# Patient Record
Sex: Female | Born: 1955 | Race: White | Hispanic: No | Marital: Married | Smoking: Never smoker
Health system: Southern US, Community
[De-identification: ages and names within clinical notes are randomized; demographics above are authoritative.]

## PROBLEM LIST (undated history)

## (undated) DIAGNOSIS — F419 Anxiety disorder, unspecified: Secondary | ICD-10-CM

## (undated) DIAGNOSIS — M797 Fibromyalgia: Secondary | ICD-10-CM

## (undated) DIAGNOSIS — M5126 Other intervertebral disc displacement, lumbar region: Secondary | ICD-10-CM

## (undated) DIAGNOSIS — M1990 Unspecified osteoarthritis, unspecified site: Secondary | ICD-10-CM

## (undated) HISTORY — PX: ABDOMINAL HYSTERECTOMY: SHX81

## (undated) HISTORY — PX: BILATERAL CARPAL TUNNEL RELEASE: SHX6508

## (undated) HISTORY — PX: SEPTOPLASTY: SUR1290

## (undated) HISTORY — PX: RECTOVAGINAL FISTULA CLOSURE: SUR265

---

## 1995-09-02 HISTORY — PX: BREAST BIOPSY: SHX20

## 2001-07-07 IMAGING — MG UNKNOWN MG STUDY
1 series · 6 of 6 positions shown · non-contrast
Comparison: none

REASON FOR EXAM: SCR......HM 226 [A2]...WOULD LIKE DIGITAL

[Series 1780: R CC · right · 6 of 6 slices shown]
[im 1/6]
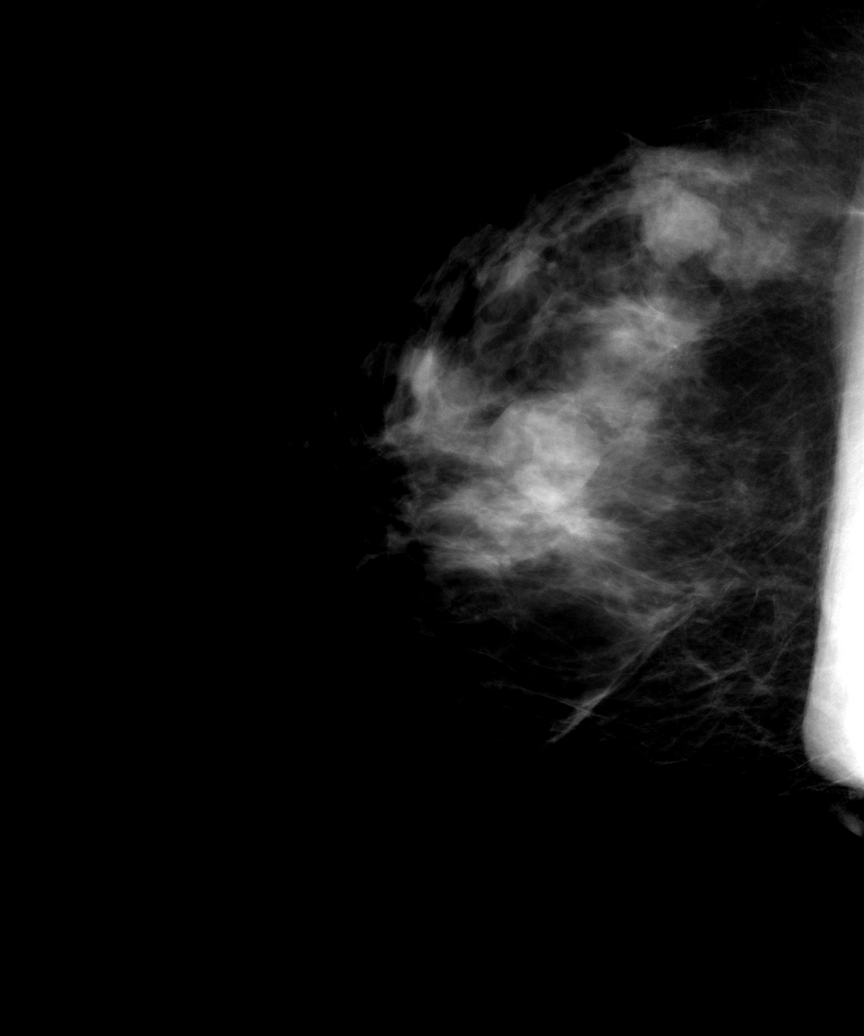
[im 2/6]
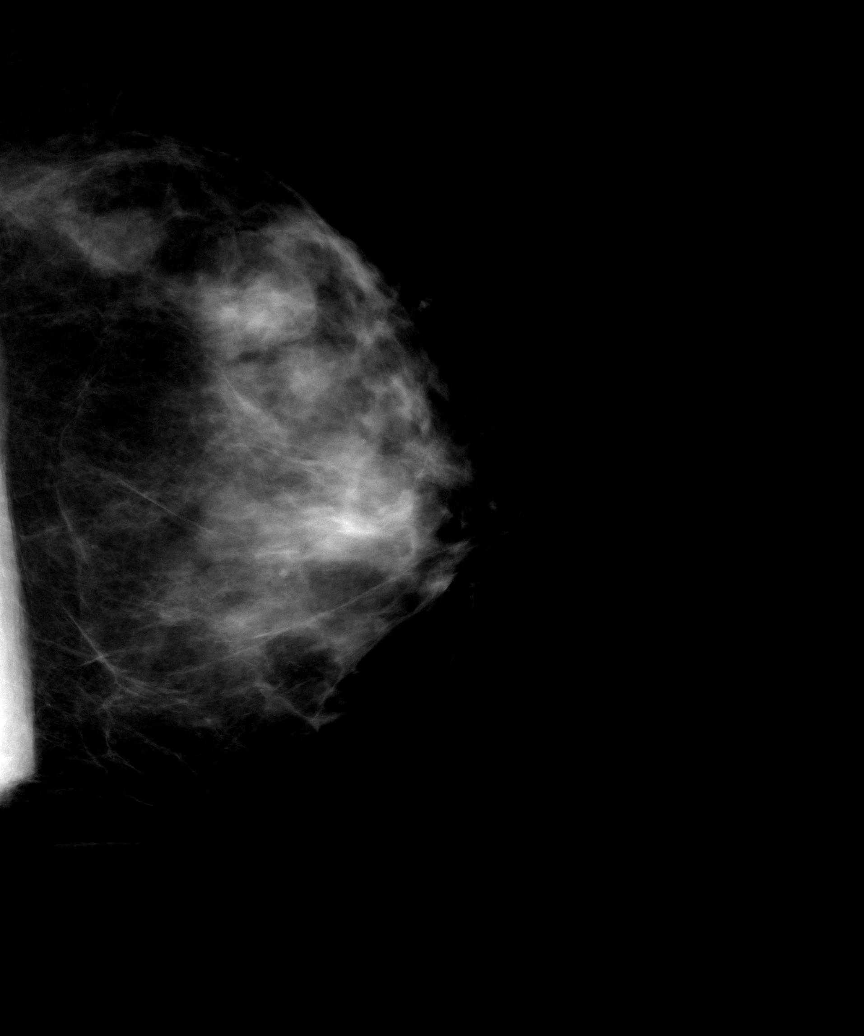
[im 3/6]
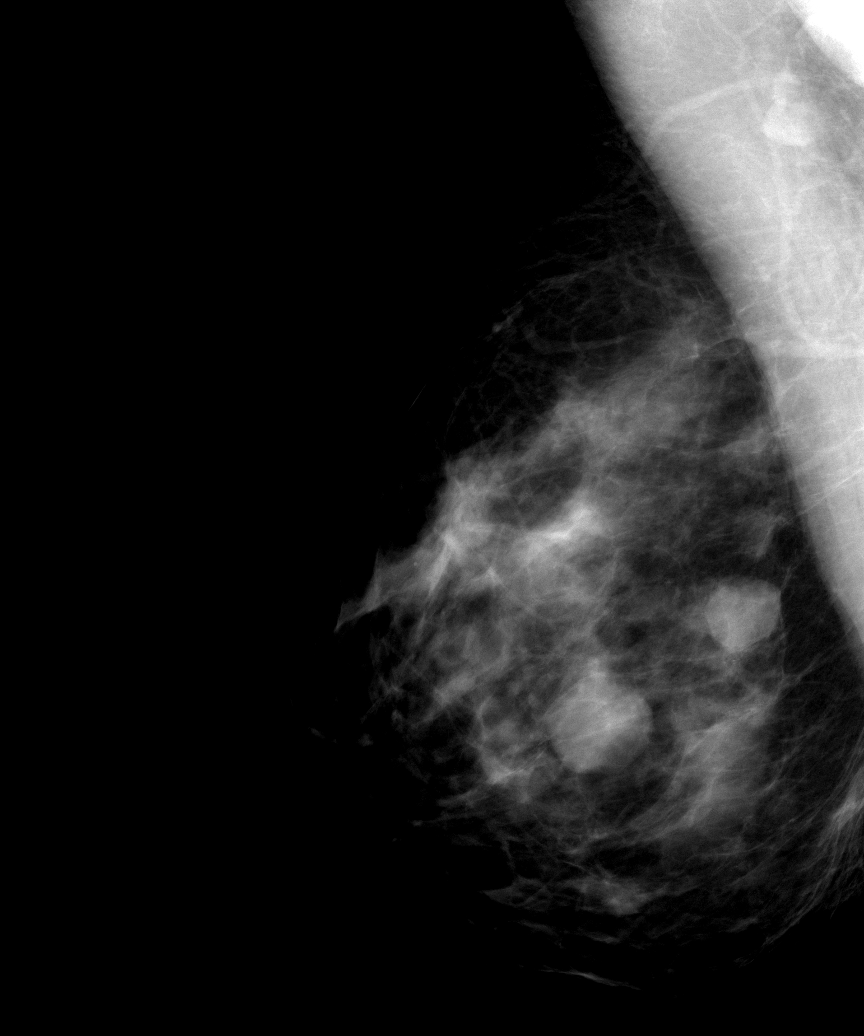
[im 4/6]
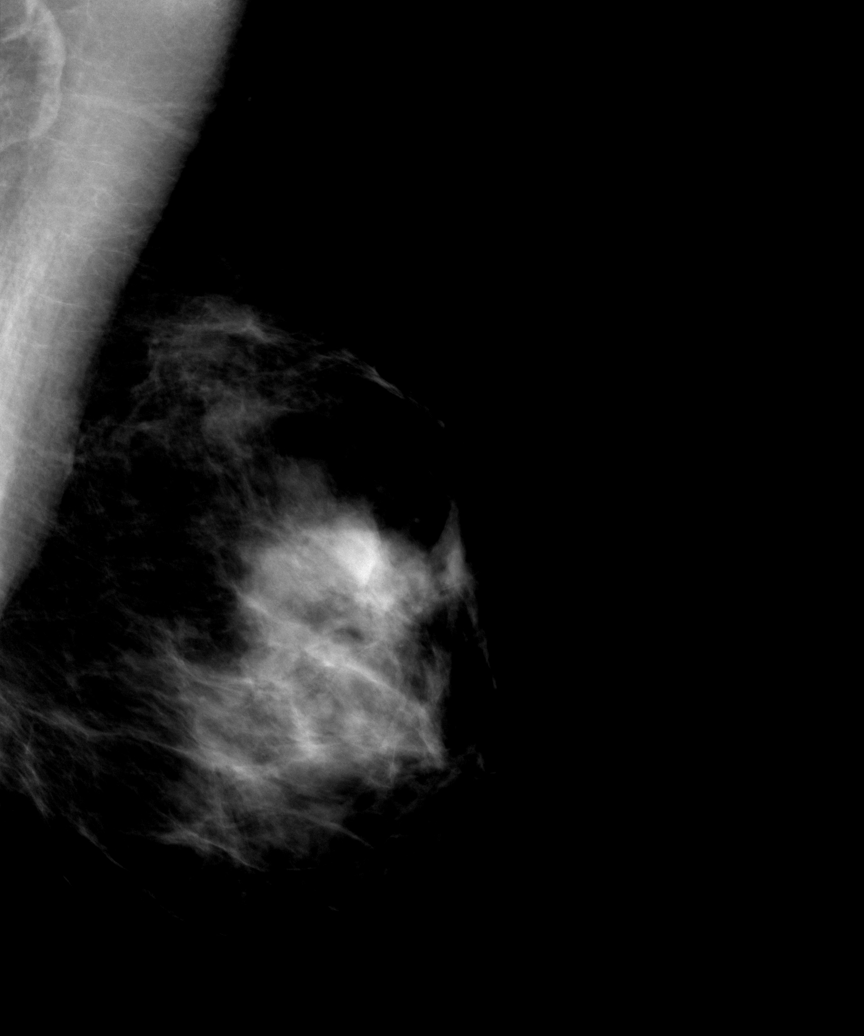
[im 5/6]
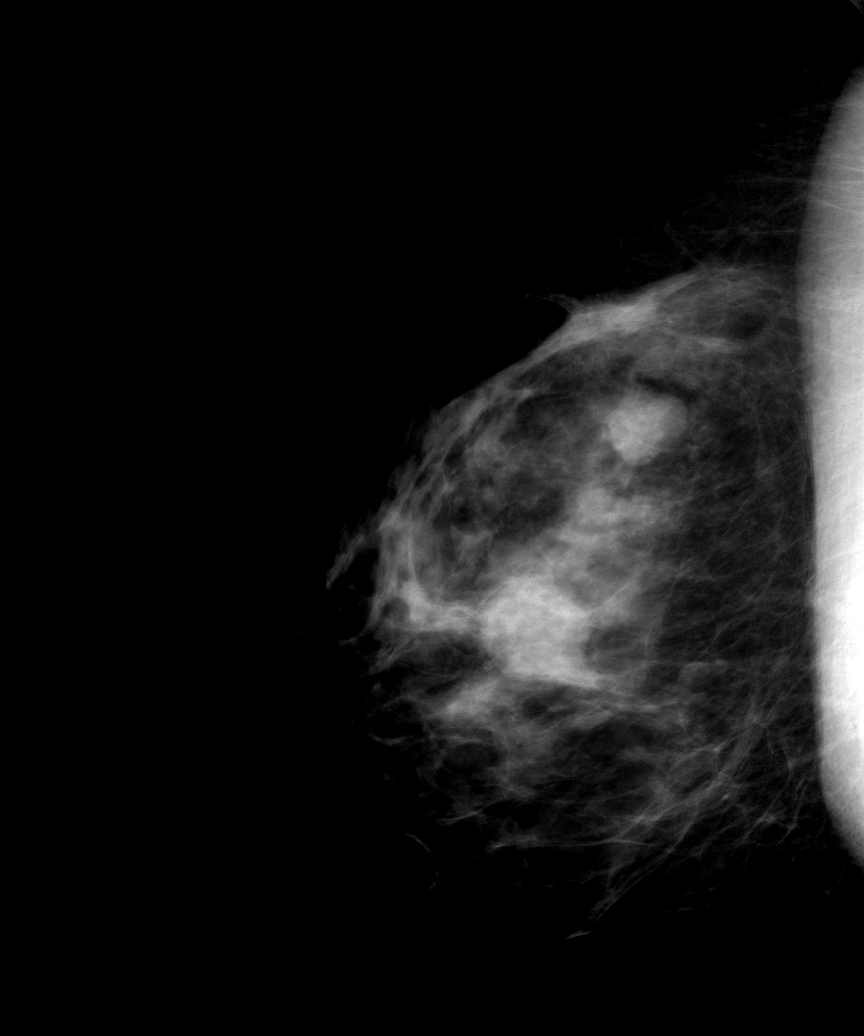
[im 6/6]
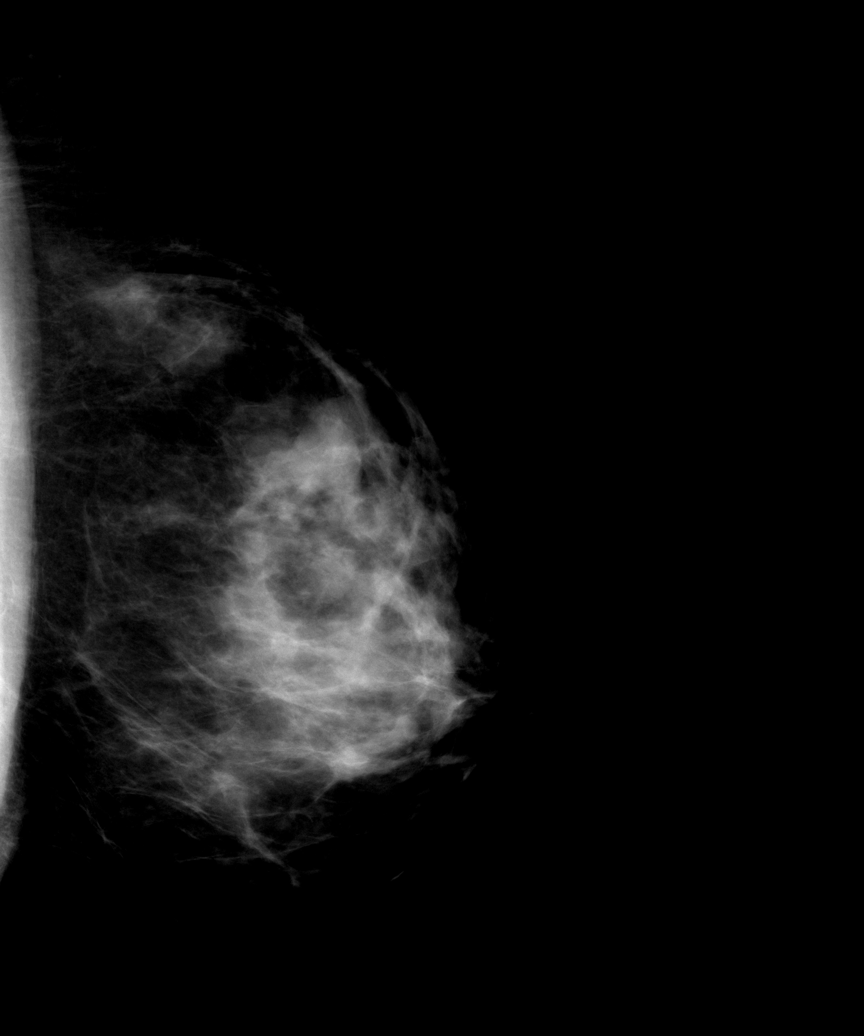

[6 of 6 positions shown; findings below may reference images not displayed]

Procedure: DIGITAL BILATERAL SCREENING MAMMOGRAPHY WITH CAD

 Comparison is made to a prior study dated [DATE].

 Two fairly well circumscribed rounded densities are demonstrated within the
RIGHT breast along the central retroareolar portion as well as long the
lateral superior posterior portion. The more posterior mass was appreciated
on the prior study and is relatively unchanged and likely represents a
benign finding such as a cyst. The more anterior mass is new and considering
the 90% well-circumscribed and 10% obscured, it likely also represents a
cyst. Multiple rounded densities are demonstrated within the LEFT breast
which are well-circumscribed and partially obscured. These, too, likely
represent benign findings. No evidence of spiculated masses or densities are
demonstrated nor areas of architectural distortion. There is no evidence of
malignant type calcifications.
IMPRESSION: Likely benign findings in the RIGHT and LEFT breast. A repeat evaluation in
6-months is recommended to evaluate stability or change. BI-RADS: Category
3-Probably Benign Finding. 6-month bilateral follow up recommended.
 A NEGATIVE MAMMOGRAM REPORT DOES NOT PRECLUDE BIOPSY OR OTHER EVALUATION OF
A CLINICALLY PALPABLE OR OTHERWISE SUSPICIOUS MASS OR LESION. BREAST CANCER
MAY NOT BE DETECTED BY MAMMOGRAPHY IN UP TO 10% OF CASES.

## 2004-10-25 ENCOUNTER — Ambulatory Visit: Payer: Self-pay | Admitting: Unknown Physician Specialty

## 2006-04-09 ENCOUNTER — Ambulatory Visit: Payer: Self-pay | Admitting: Unknown Physician Specialty

## 2007-05-18 ENCOUNTER — Ambulatory Visit: Payer: Self-pay | Admitting: Unknown Physician Specialty

## 2007-05-21 ENCOUNTER — Ambulatory Visit: Payer: Self-pay | Admitting: Unknown Physician Specialty

## 2007-05-21 IMAGING — US ULTRASOUND LEFT BREAST
1 series · 11 of 11 positions shown · non-contrast
Comparison: none

REASON FOR EXAM: Left  breast nodule
COMMENTS:

PROCEDURE:     US  - US BREAST LEFT  - [DATE]  [DATE]
RESULT:     Comparison is made to LEFT mammograms on [DATE] and
[DATE].

[Series 1: ultrasound left breast · 11 of 11 slices shown]
[im 1/11]
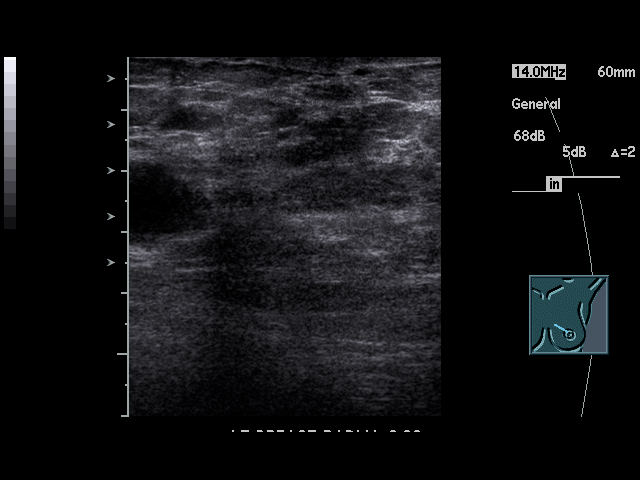
[im 2/11]
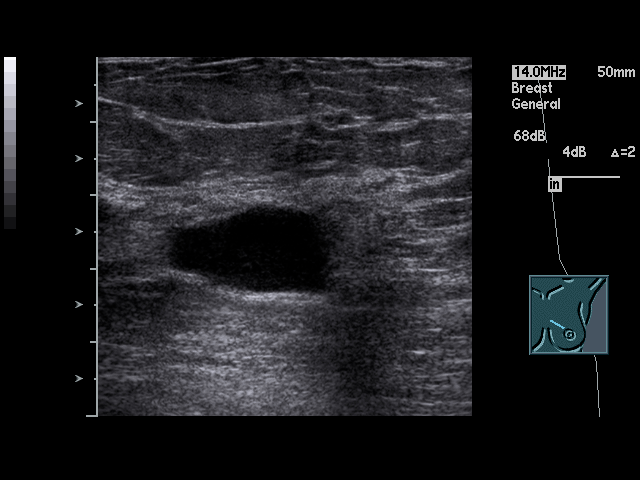
[im 3/11]
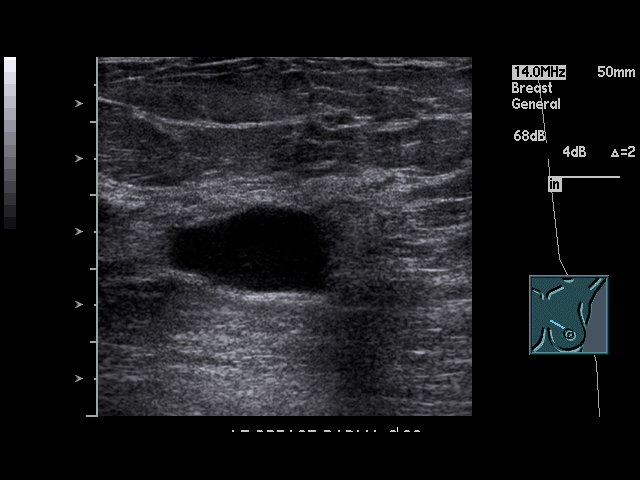
[im 4/11]
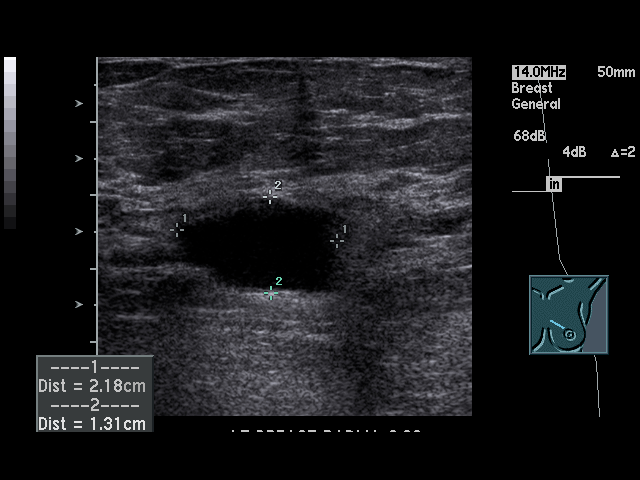
[im 5/11]
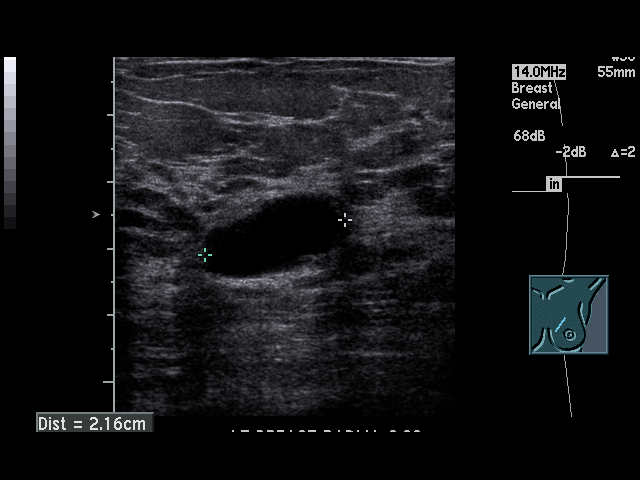
[im 6/11]
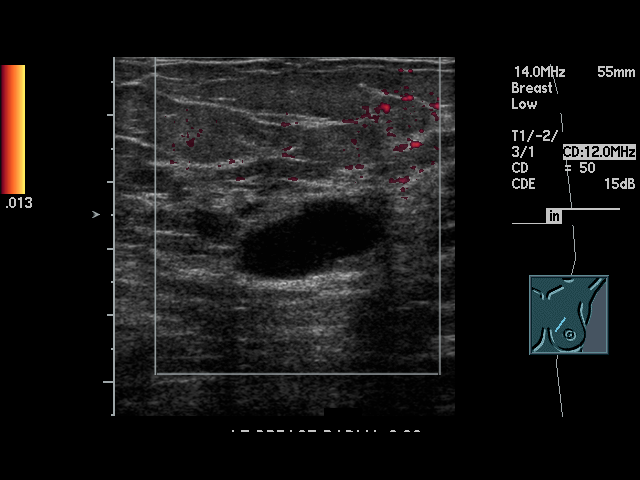
[im 7/11]
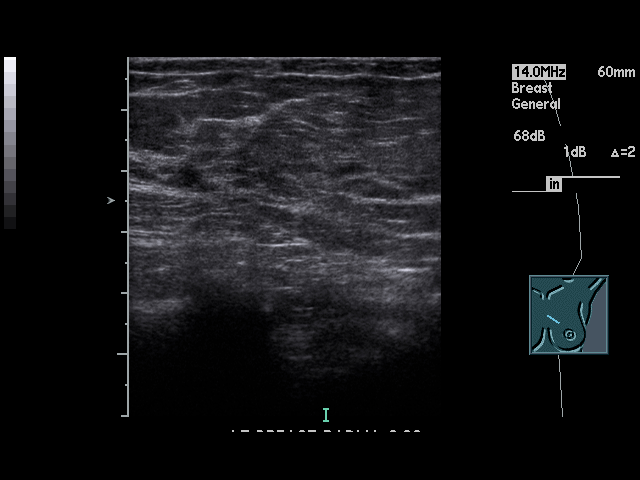
[im 8/11]
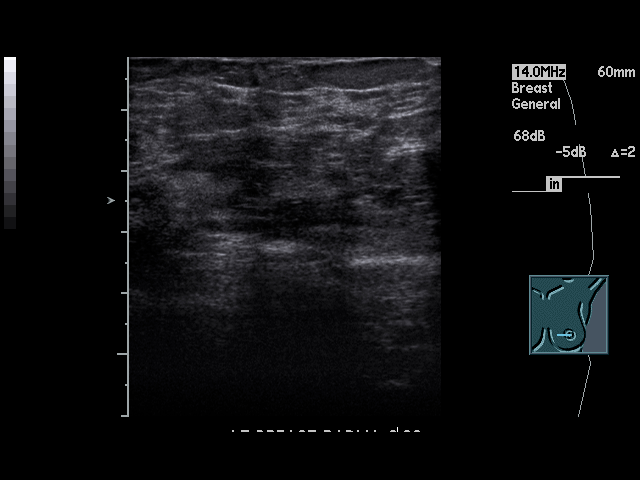
[im 9/11]
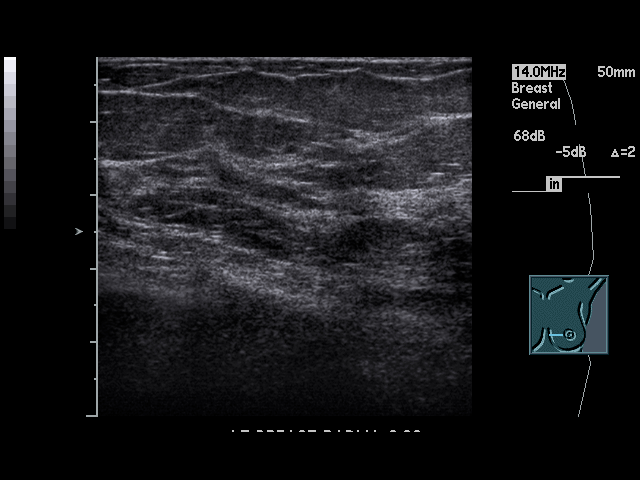
[im 10/11]
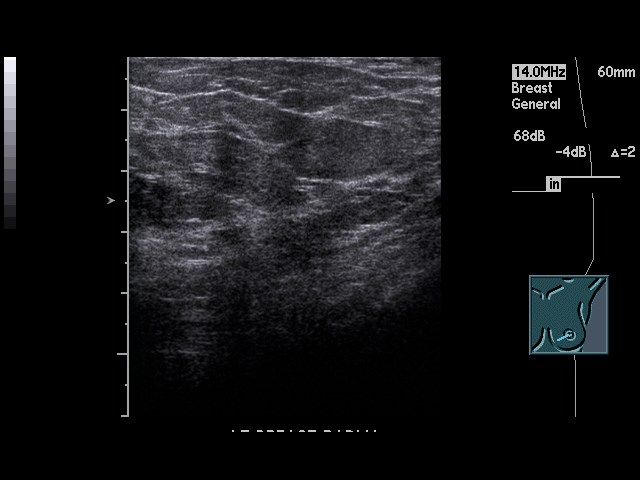
[im 11/11]
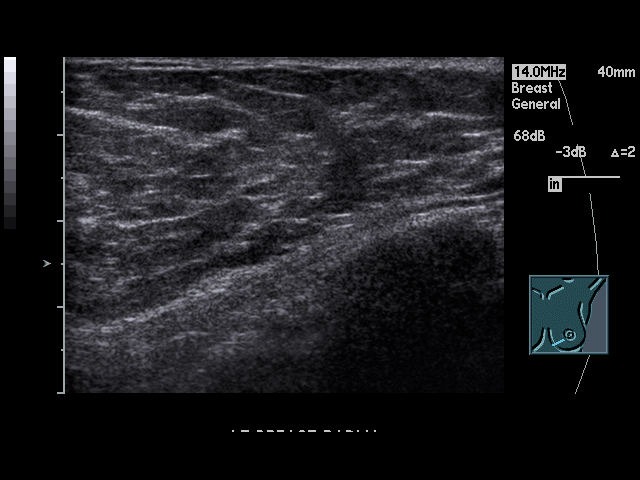

[11 of 11 positions shown; findings below may reference images not displayed]

FINDINGS: Focused Ultrasound LEFT Breast was performed from 8 to 10 o'clock.
At the 8 o'clock region, there is a 2.2 x 1.3 x 2.2 cm, anechoic,
circumscribed structure with posterior acoustic enhancement consistent with
a simple cyst. This corresponds to circumscribed nodule involving the medial
LEFT breast as seen on the patient's mammogram on [DATE]. This is a
benign finding. No solid mass is noted.
IMPRESSION: 1.     Circumscribed nodule involving the medial LEFT breast as seen on
patient's [DATE] mammogram corresponds to a simple cyst. This is a
benign finding.
2.     Recommend follow-up bilateral mammograms in one year.
3.     BI-RADS: Category 2 - Benign Finding.

Thank you for this opportunity to contribute to the care of your patient.

A NEGATIVE MAMMOGRAM REPORT DOES NOT PRECLUDE BIOPSY OR OTHER EVALUATION OF
A CLINICALLY PALPABLE OR OTHERWISE SUSPICIOUS MASS OR LESION. BREAST CANCER
MAY NOT BE DETECTED BY MAMMOGRAPHY IN UP TO 10% OF CASES.

## 2007-06-02 ENCOUNTER — Ambulatory Visit: Payer: Self-pay | Admitting: Unknown Physician Specialty

## 2008-05-19 ENCOUNTER — Ambulatory Visit: Payer: Self-pay | Admitting: Unknown Physician Specialty

## 2008-06-26 ENCOUNTER — Ambulatory Visit: Payer: Self-pay | Admitting: Unknown Physician Specialty

## 2008-06-26 IMAGING — US ULTRASOUND LEFT BREAST
1 series · 15 of 15 positions shown · non-contrast
Comparison: none

REASON FOR EXAM: cyst at 12 to [T1] position
COMMENTS:

[Series 1: ultrasound left breast · 15 of 15 slices shown]
[im 1/15]
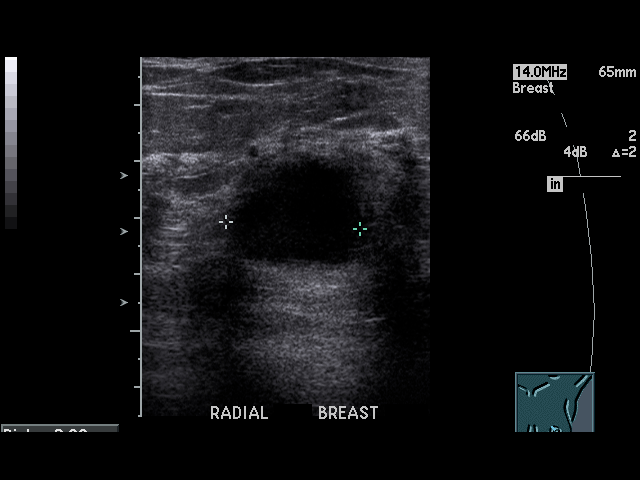
[im 2/15]
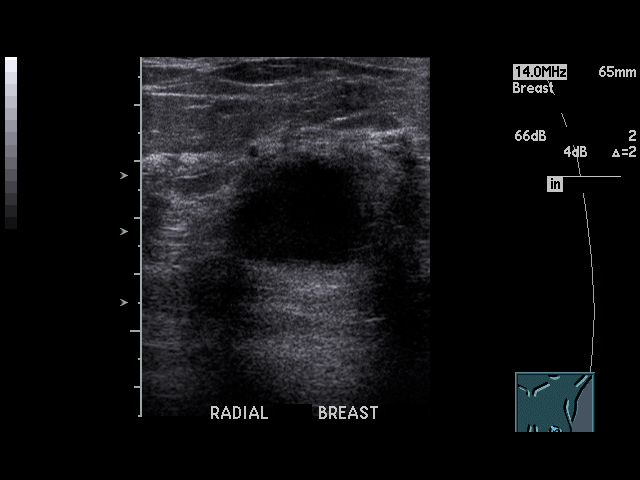
[im 3/15]
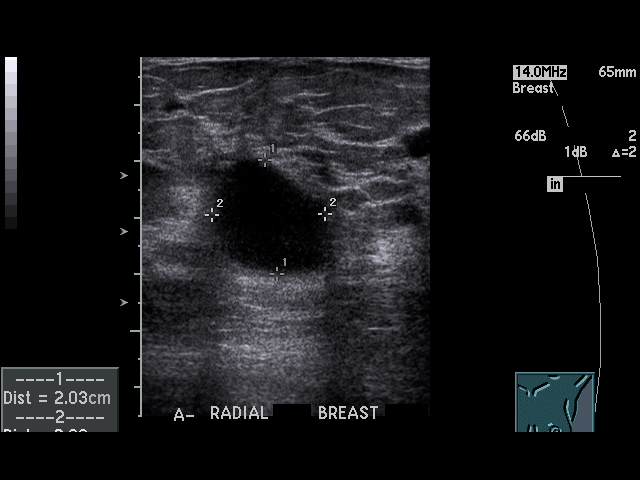
[im 4/15]
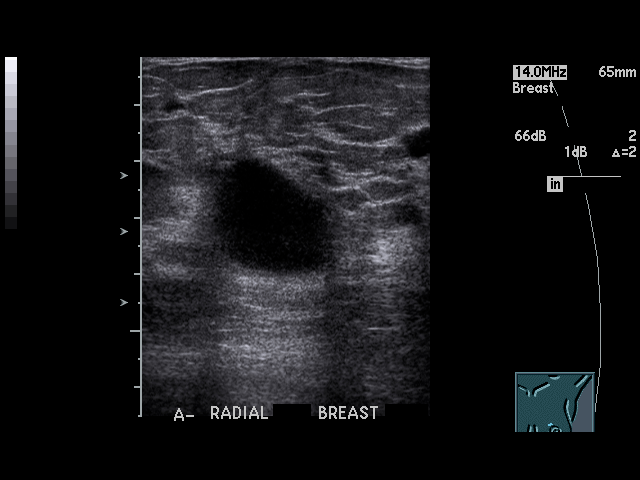
[im 5/15]
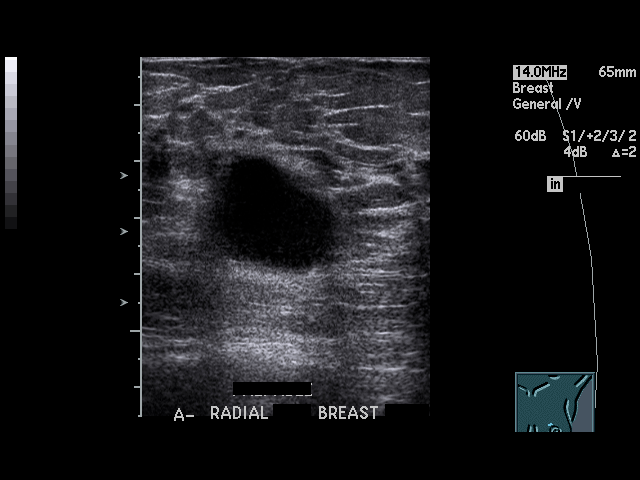
[im 6/15]
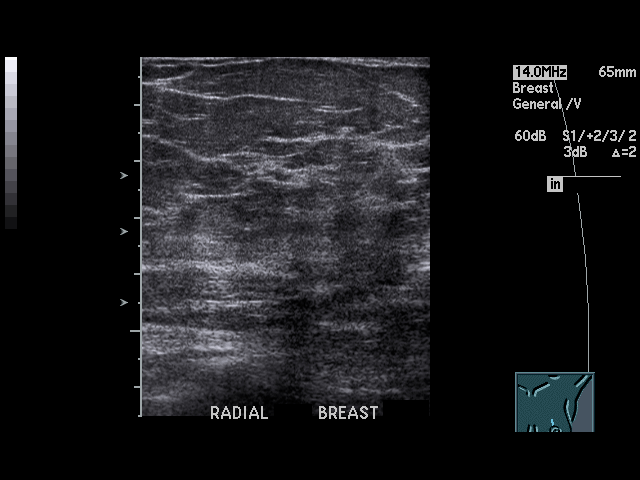
[im 7/15]
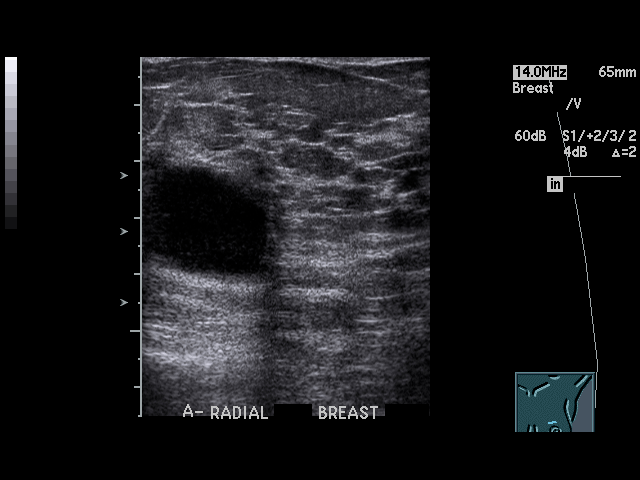
[im 8/15]
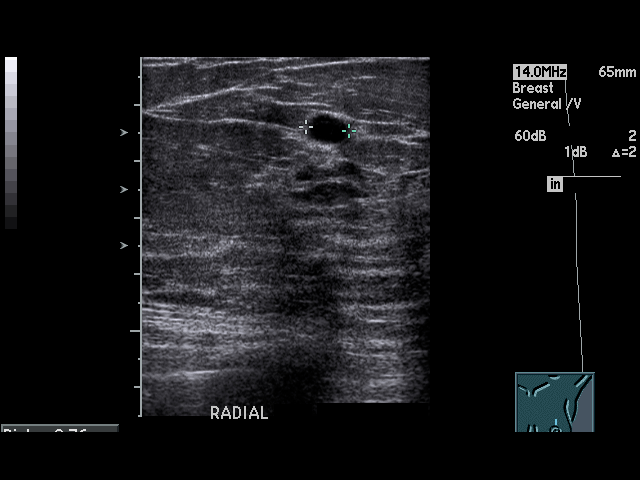
[im 9/15]
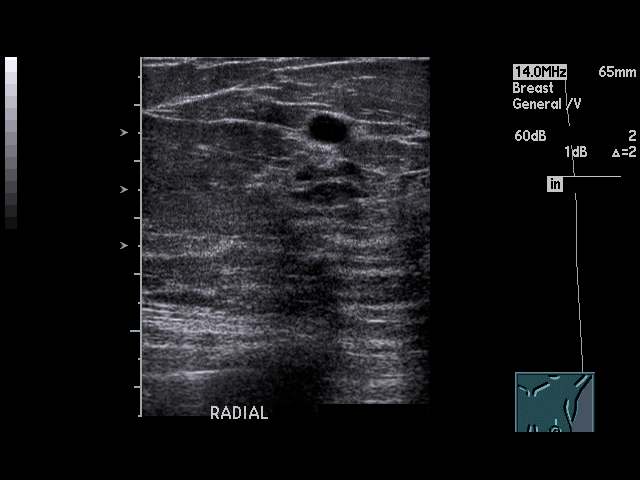
[im 10/15]
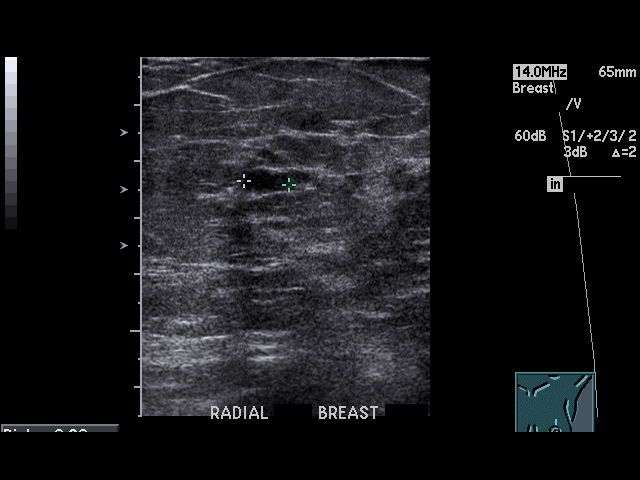
[im 11/15]
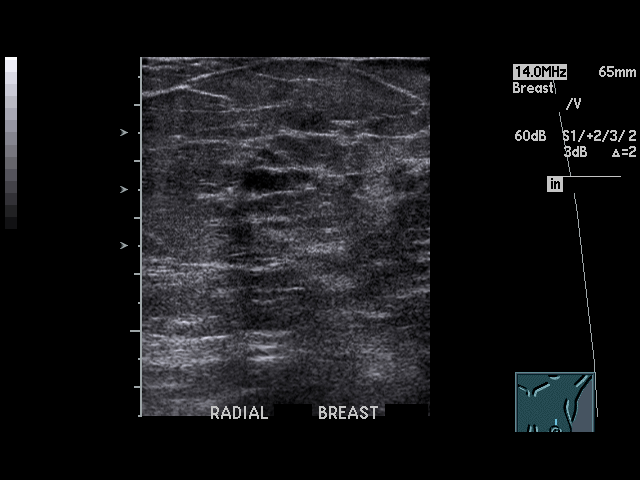
[im 12/15]
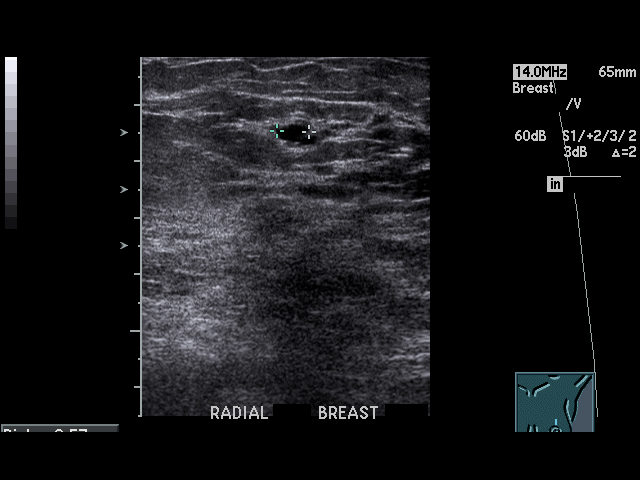
[im 13/15]
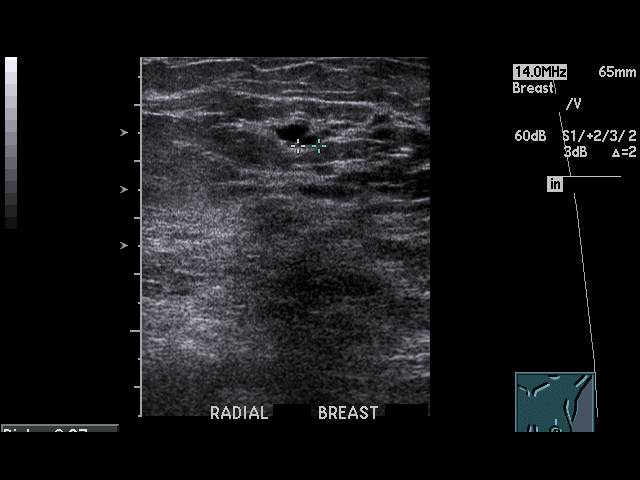
[im 14/15]
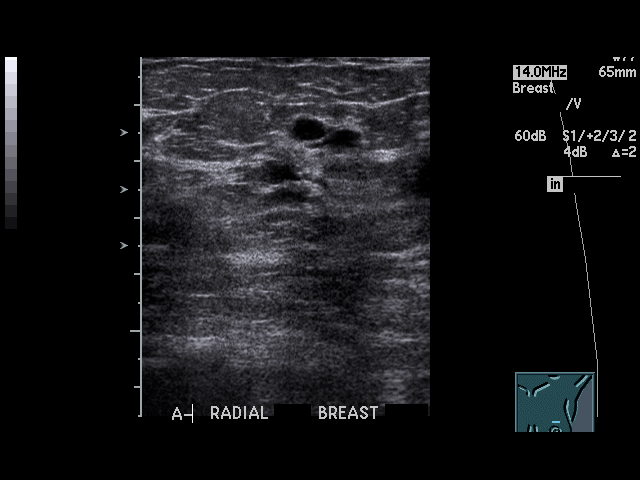
[im 15/15]
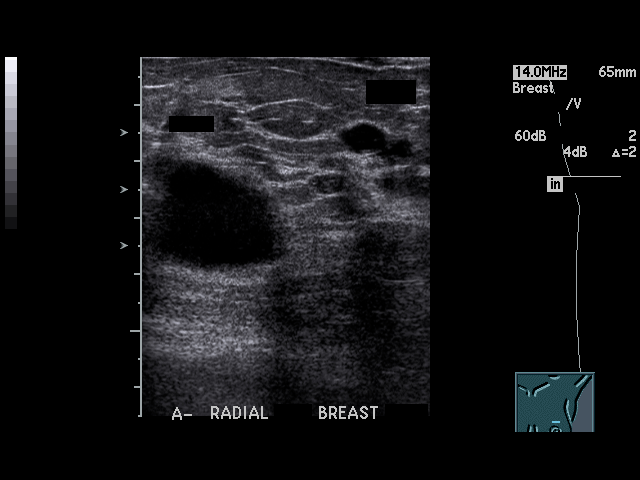

[15 of 15 positions shown; findings below may reference images not displayed]

PROCEDURE:     US  - US BREAST LEFT  - [DATE]  [DATE]

RESULT:       The patient is being evaluated for nodularity at the 12
o'clock position by history.  A mammogram [DATE] revealed
nodularity in both breasts.

Ultrasound performed today revealed an anechoic structure measuring
approximately 2.0 cm in diameter at the 10 o'clock position. It exhibits
enhanced through transmission.  At the 12 o'clock position there is an
approximately 0.8 cm diameter anechoic structure with some enhanced through
transmission demonstrated.  A smaller anechoic structure closely applied to
the first measures approximately 0.4 cm in diameter.
IMPRESSION: The area in question is consistent with a simple cyst at approximately the
10 o'clock position the RIGHT which corresponds to an area of nodularity
mammographically.  There are other smaller cystic structures at the 12
o'clock position which are not clearly evident mammographically.

## 2008-10-30 ENCOUNTER — Ambulatory Visit: Payer: Self-pay | Admitting: Gastroenterology

## 2009-06-15 ENCOUNTER — Ambulatory Visit: Payer: Self-pay | Admitting: Unknown Physician Specialty

## 2010-06-26 ENCOUNTER — Ambulatory Visit: Payer: Self-pay | Admitting: Unknown Physician Specialty

## 2011-11-14 ENCOUNTER — Ambulatory Visit: Payer: Self-pay | Admitting: Unknown Physician Specialty

## 2012-11-24 ENCOUNTER — Ambulatory Visit: Payer: Self-pay | Admitting: Family Medicine

## 2012-11-24 IMAGING — MG MM CAD SCREENING MAMMO
1 series · 4 of 4 positions shown · non-contrast
Comparison: none

REASON FOR EXAM: SCR MAMMO NO ORDER
COMMENTS:

[R CC · right · 4 of 4 slices shown]
[im 1/4]
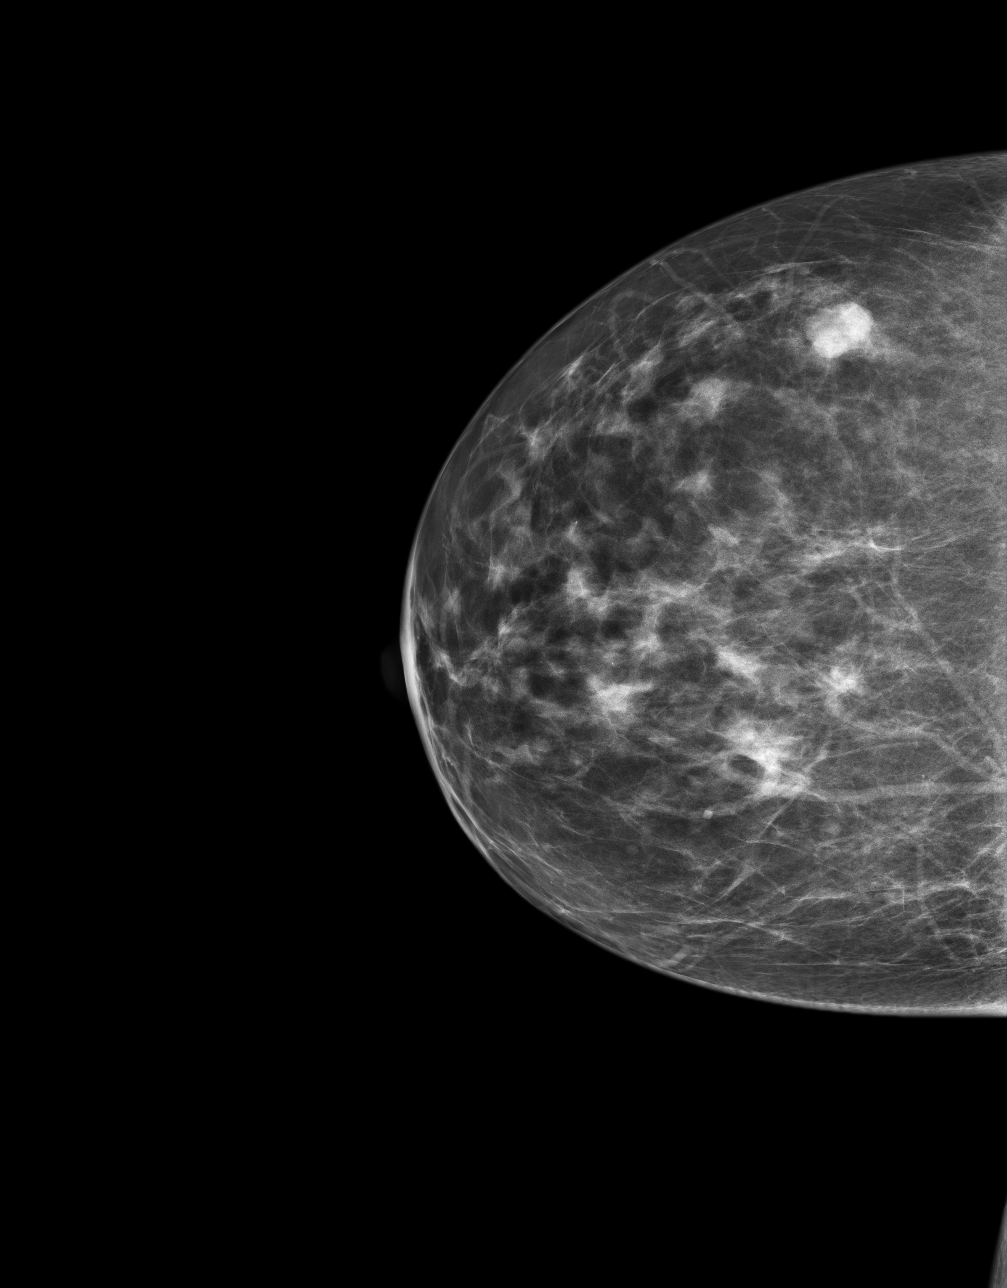
[im 2/4]
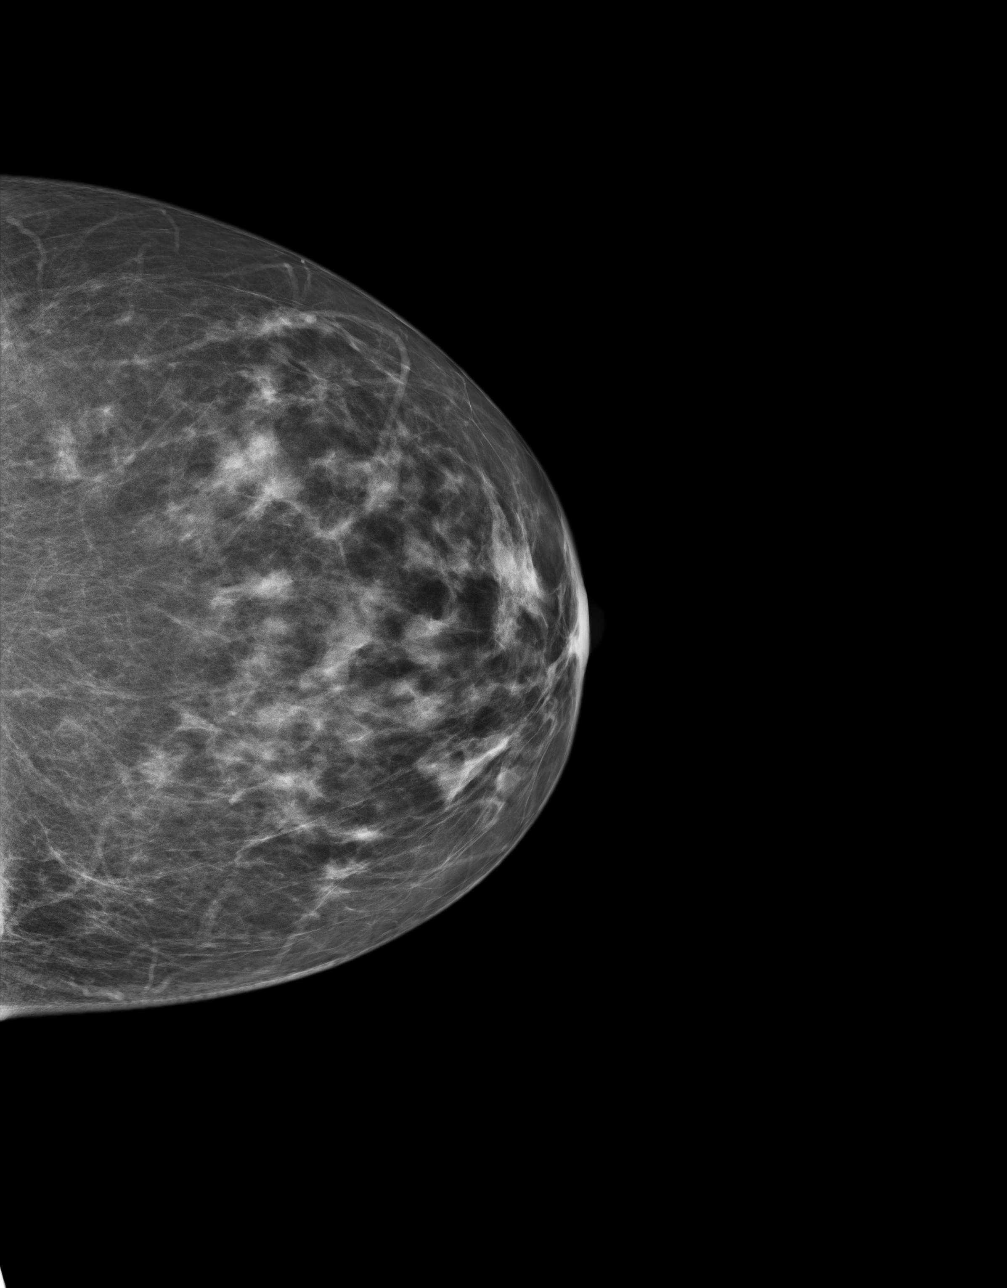
[im 3/4]
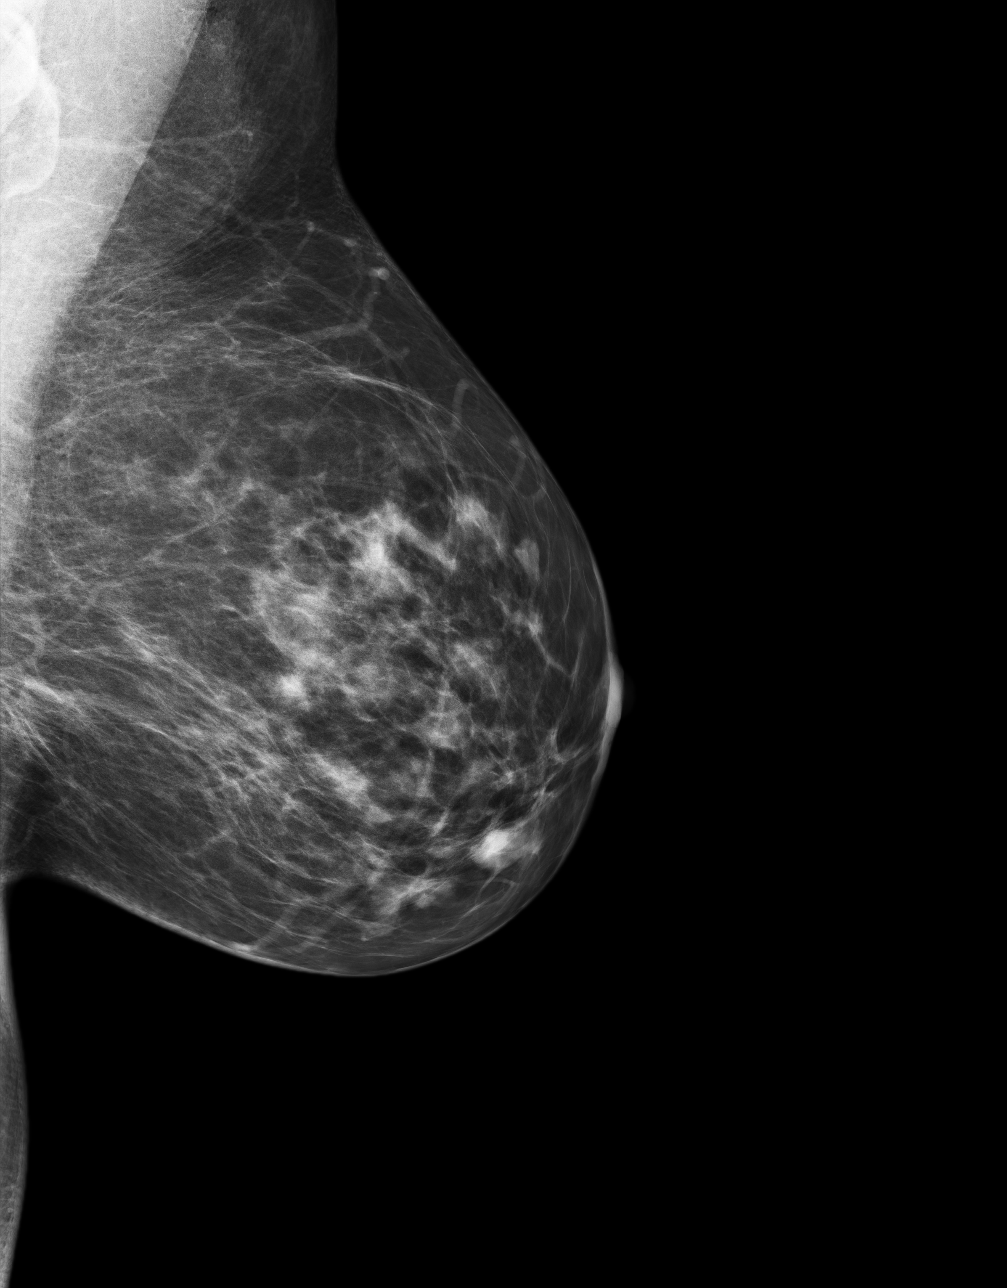
[im 4/4]
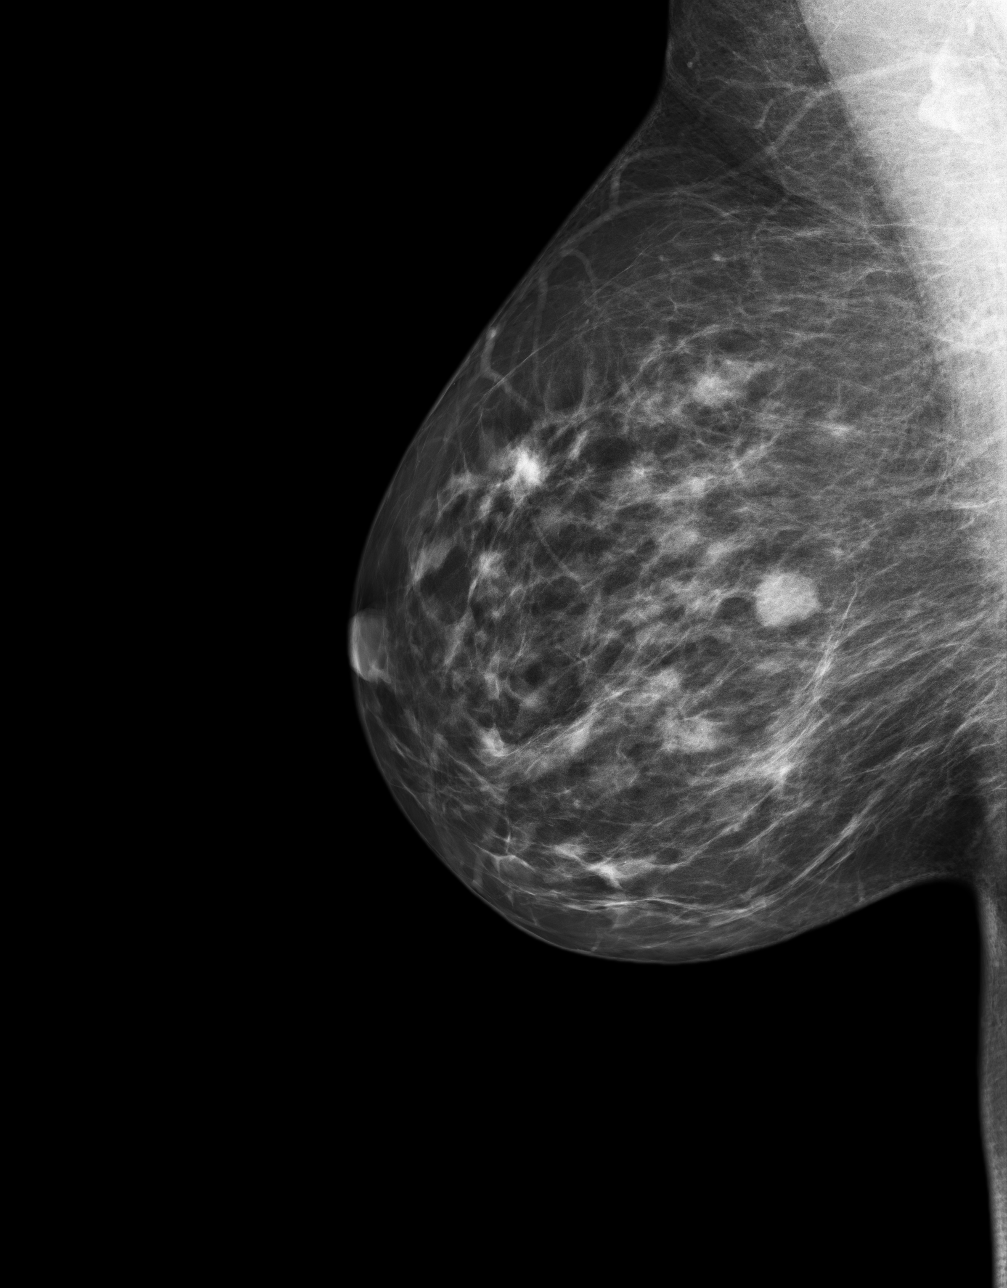

[4 of 4 positions shown; findings below may reference images not displayed]

PROCEDURE:     MAM - MAM DGTL SCREENING MAMMO W/CAD  - [DATE]  [DATE]

RESULT:     Comparison made to prior studies dating to [DATE]. Nodular
density is noted in the inferior portion of the left breast in the
retroareolar region. This is a new finding. Multiple nodular densities
present in both breasts elsewhere are stable. Benign calcifications. CAD
evaluation nonfocal.
IMPRESSION: New nodular density in the inferior retroareolar portion
left breast for which compression spot studies suggested for further
evaluation. Ultrasound can be obtained if needed.

BI-RADS: Catagory 0 - Need Additional Imaging Evaluation

A NEGATIVE MAMMOGRAM REPORT DOES NOT PRECLUDE BIOPSY OR OTHER EVALUATION OF
A CLINICALLY PALPABLE OR OTHERWISE SUSPICIOUS MASS OR LESION. BREAST CANCER
MAY NOT BE DETECTED IN UP TO 10% OF CASES.

Thank you for the oppurtunity to contribute to the care of your patient. .

BREAST COMPOSITION: The breast composition is HETEROGENEOUSLY DENSE
(glandular tissue is 51-75%) This may decrease the sensitivity of
mammography.

## 2012-11-26 ENCOUNTER — Ambulatory Visit: Payer: Self-pay | Admitting: Family Medicine

## 2012-11-26 IMAGING — MG MM ADDITIONAL VIEWS AT NO CHARGE
1 series · 3 of 3 positions shown · non-contrast
Comparison: none

REASON FOR EXAM: av lt nodular density
COMMENTS:

PROCEDURE:     MAM - MAM DGTL ADD VW LT  SCR  - [DATE]  [DATE]
RESULT:     Previous identified questionable nodule in the left breast
represents a skin lesion as evidenced by compression spot films with skin
markers. Clinical correlation suggested.

[L ML · left · 3 of 3 slices shown]
[im 1/3]
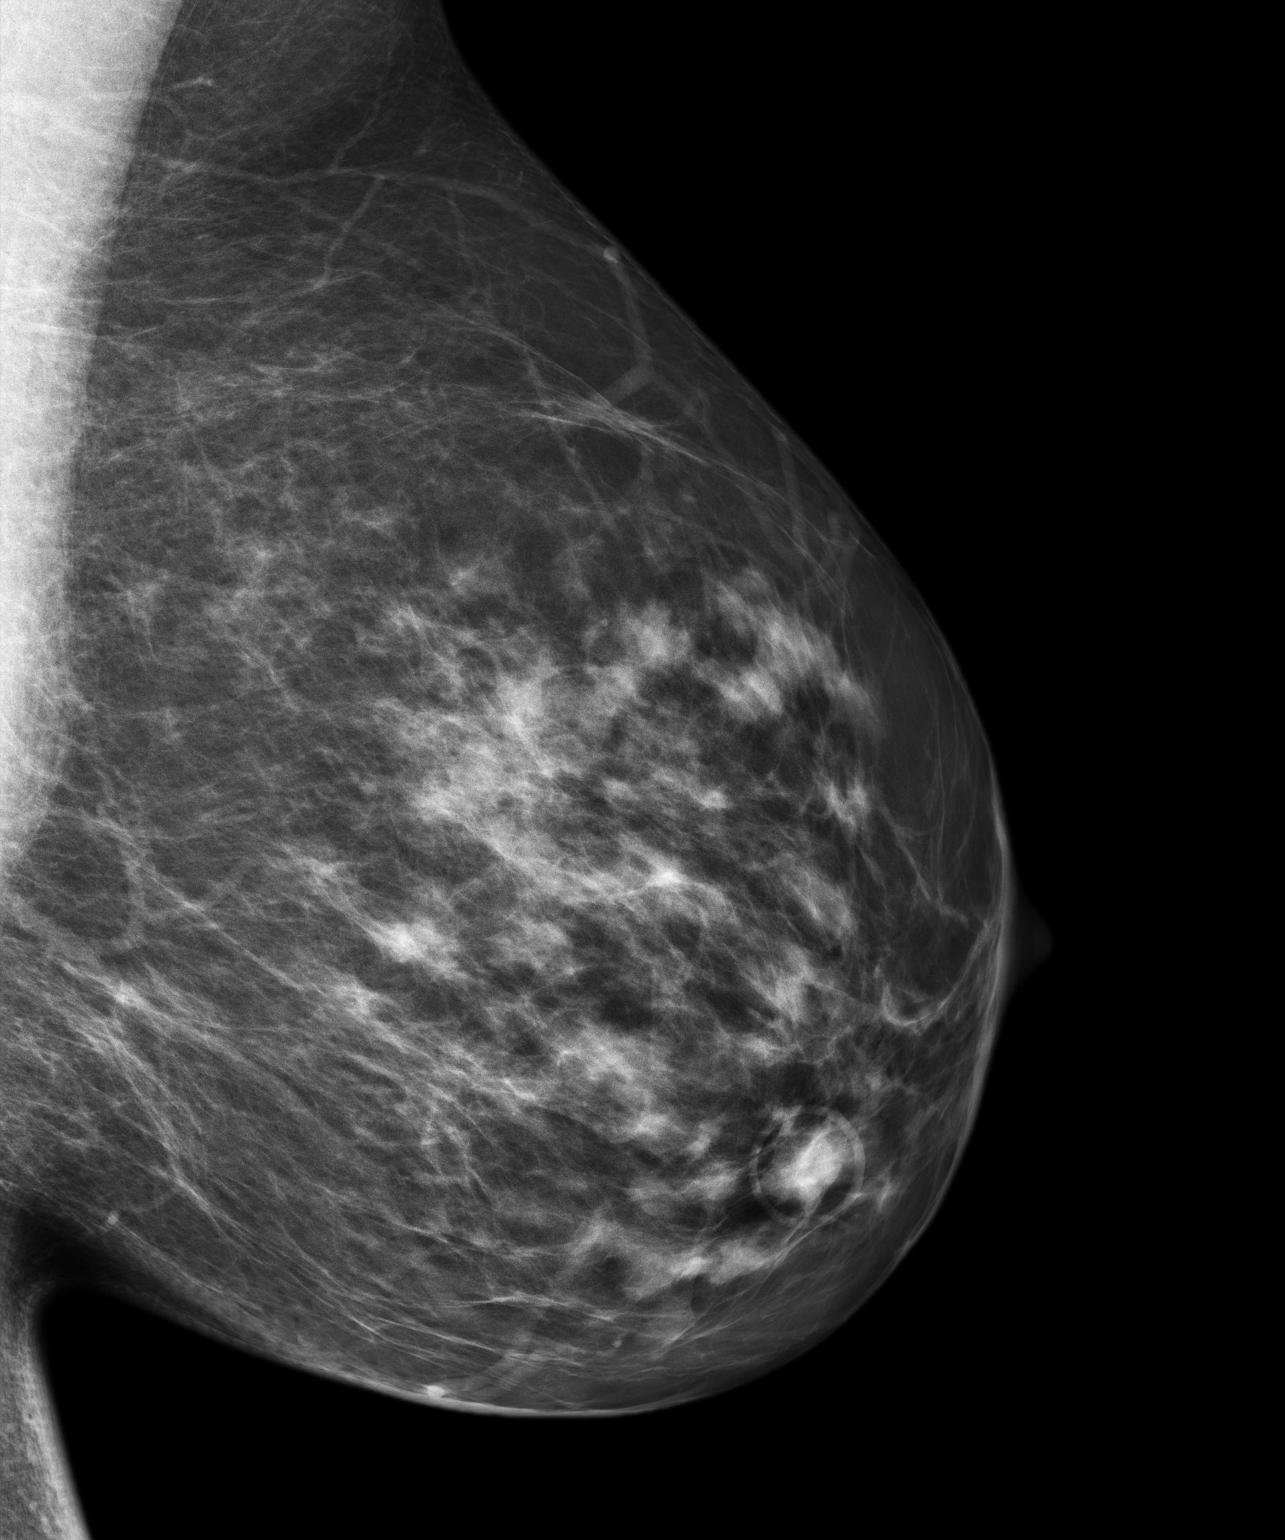
[im 2/3]
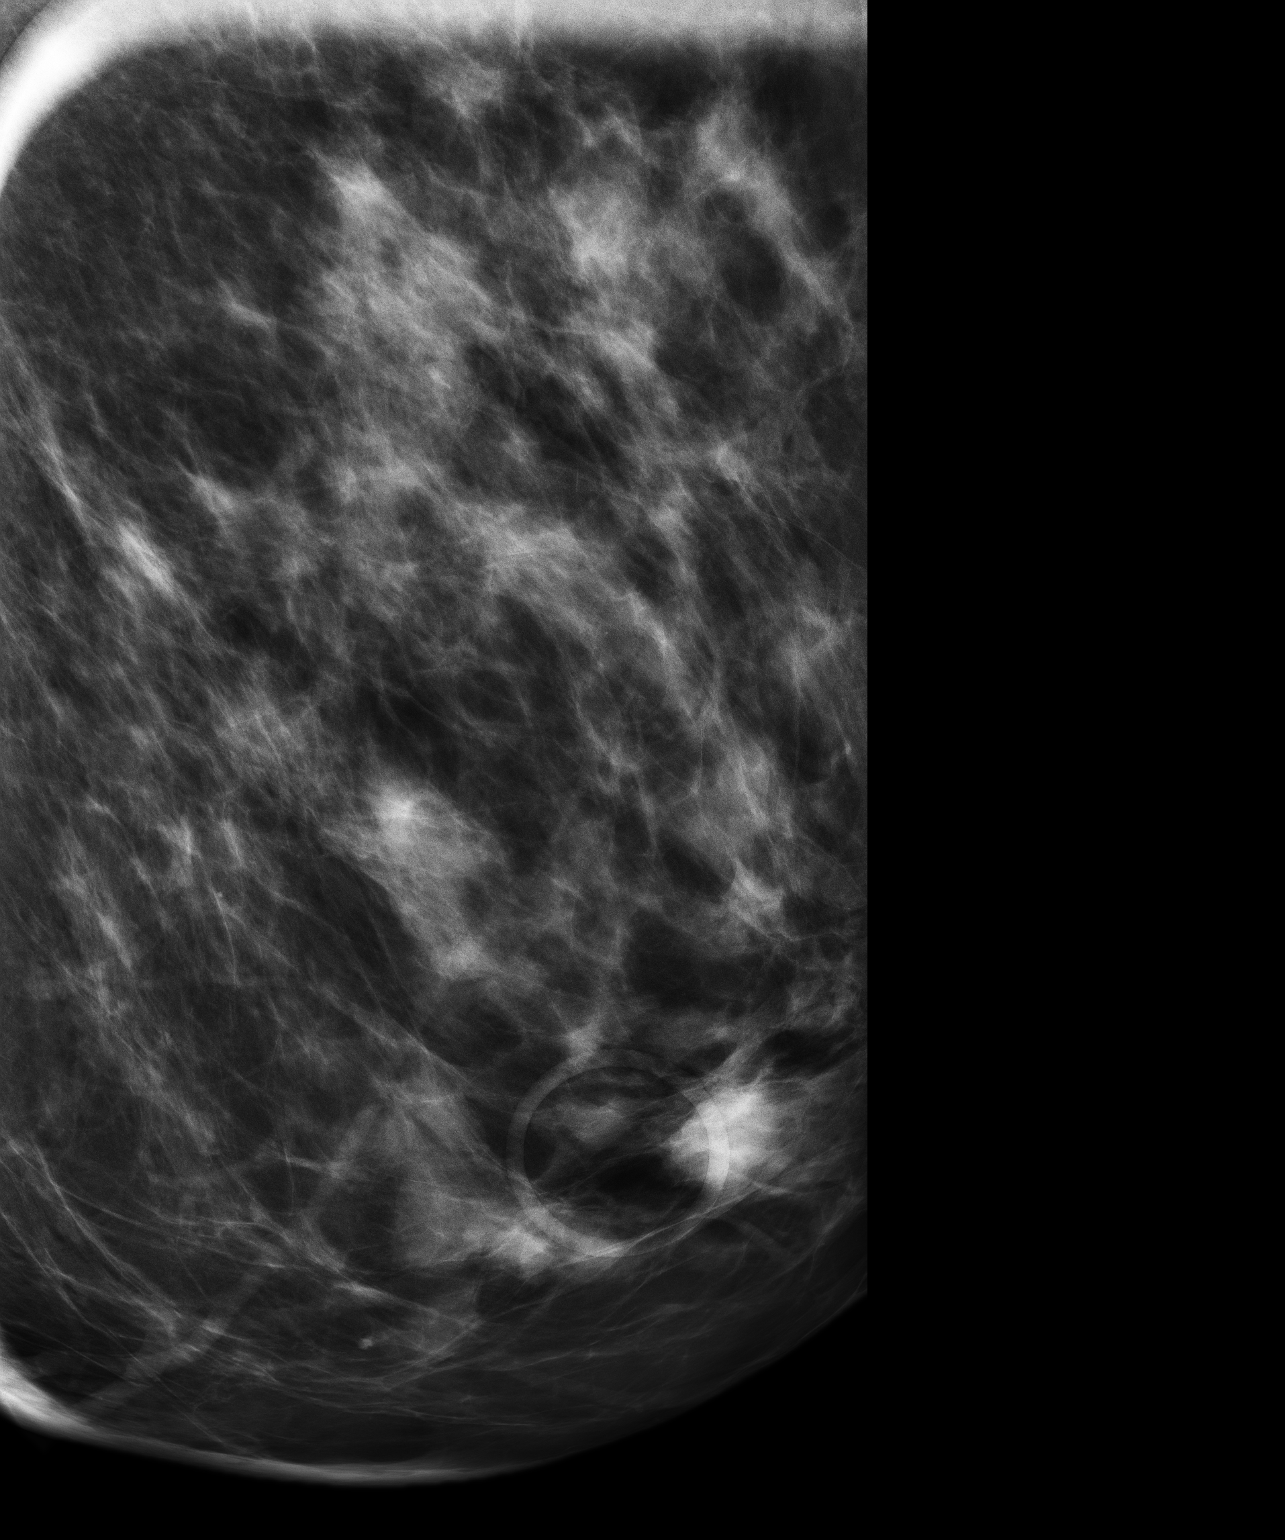
[im 3/3]
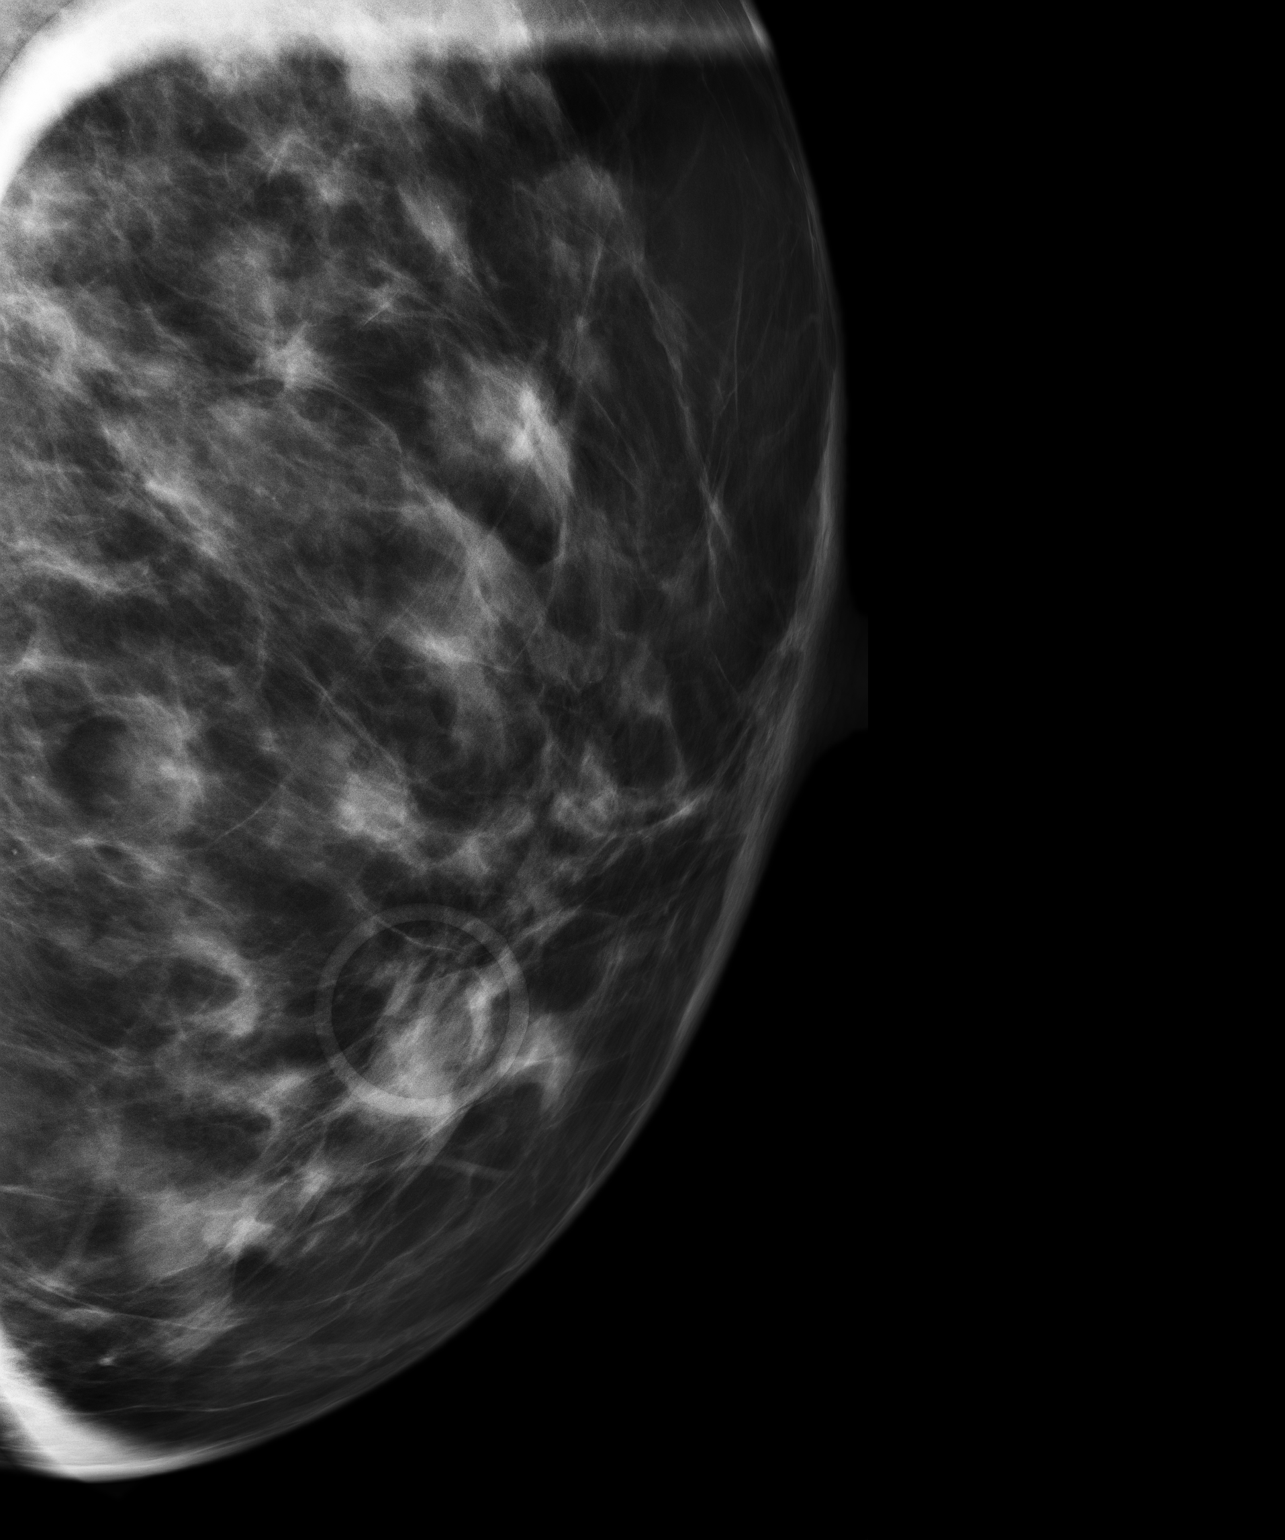

[3 of 3 positions shown; findings below may reference images not displayed]

IMPRESSION: Findings consistent with a skin lesion, most likely a mole.

BI-RADS: Category 2- Benign Finding yearly followup exam suggested.

A NEGATIVE MAMMOGRAM REPORT DOES NOT PRECLUDE BIOPSY OR OTHER EVALUATION OF
A CLINICALLY PALPABLE OR OTHERWISE SUSPICIOUS MASS OR LESION. BREAST CANCER
MAY NOT BE DETECTED IN UP TO 10% OF CASES.

## 2014-02-13 DIAGNOSIS — M797 Fibromyalgia: Secondary | ICD-10-CM | POA: Diagnosis present

## 2014-07-04 ENCOUNTER — Ambulatory Visit: Payer: Self-pay | Admitting: Nurse Practitioner

## 2014-07-04 IMAGING — CR DG KNEE COMPLETE 4+V*R*
1 series · 4 of 4 positions shown · non-contrast
Comparison: None.

CLINICAL DATA: Chronic right knee pain for 1 week. History of
fibromyalgia.

EXAM:
RIGHT KNEE - COMPLETE 4+ VIEW

[Series 1: t knee ap right · 0.14mm/px · 4 of 4 slices shown]
[im 1/4]
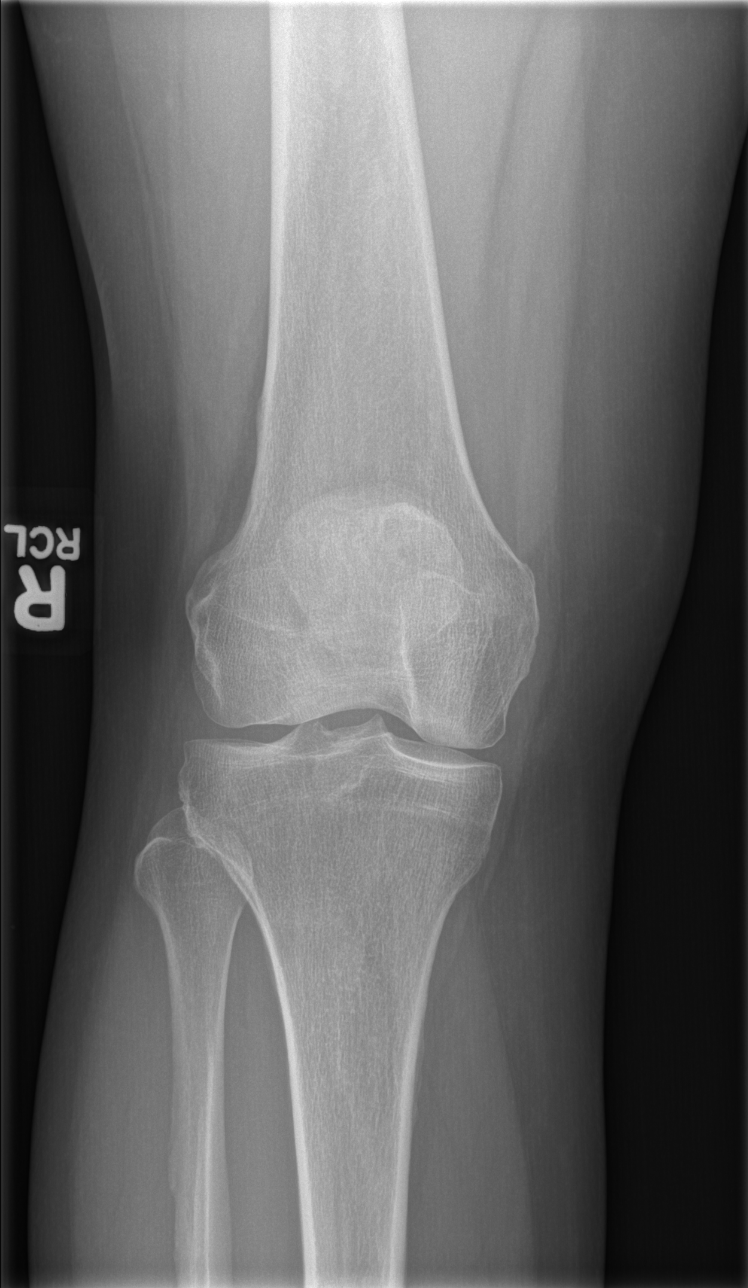
[im 2/4]
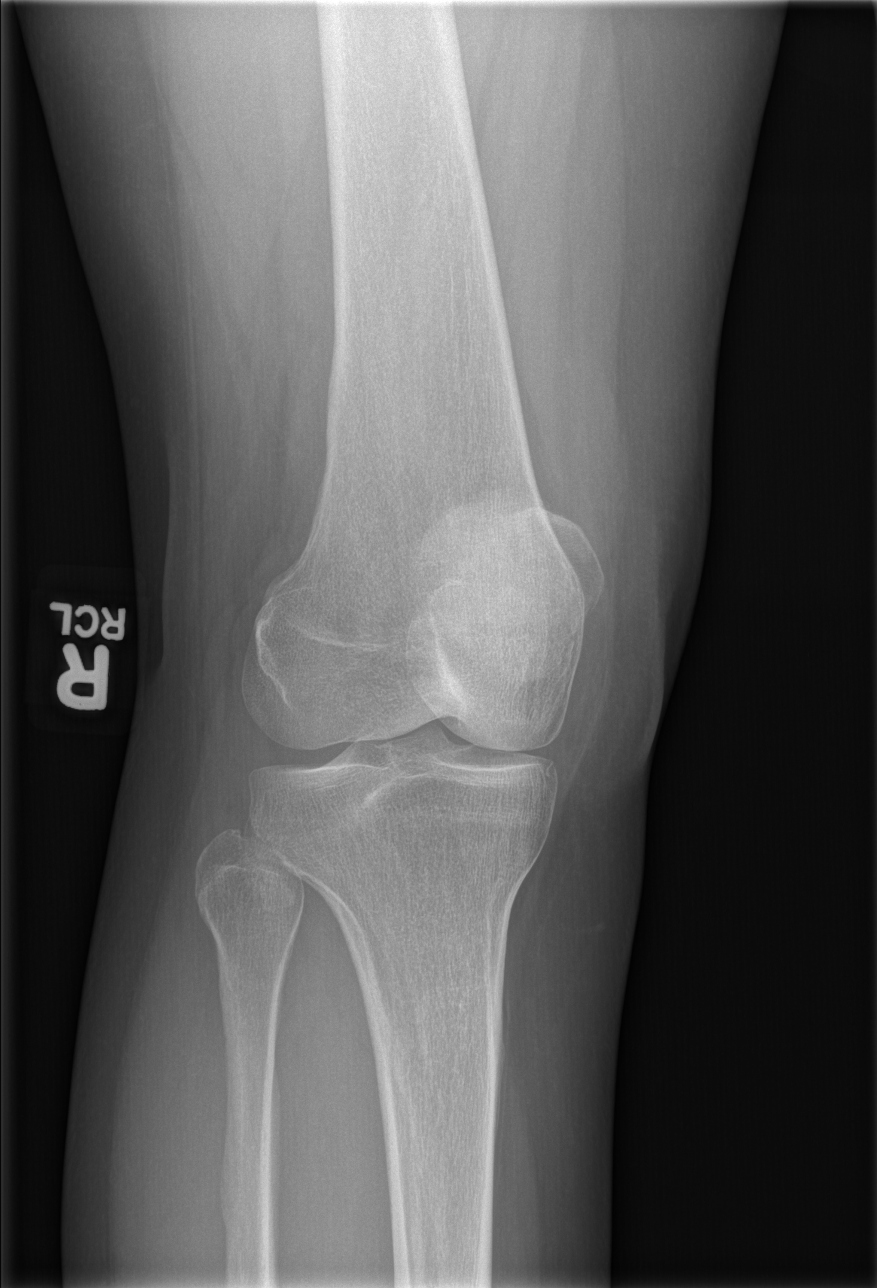
[im 3/4]
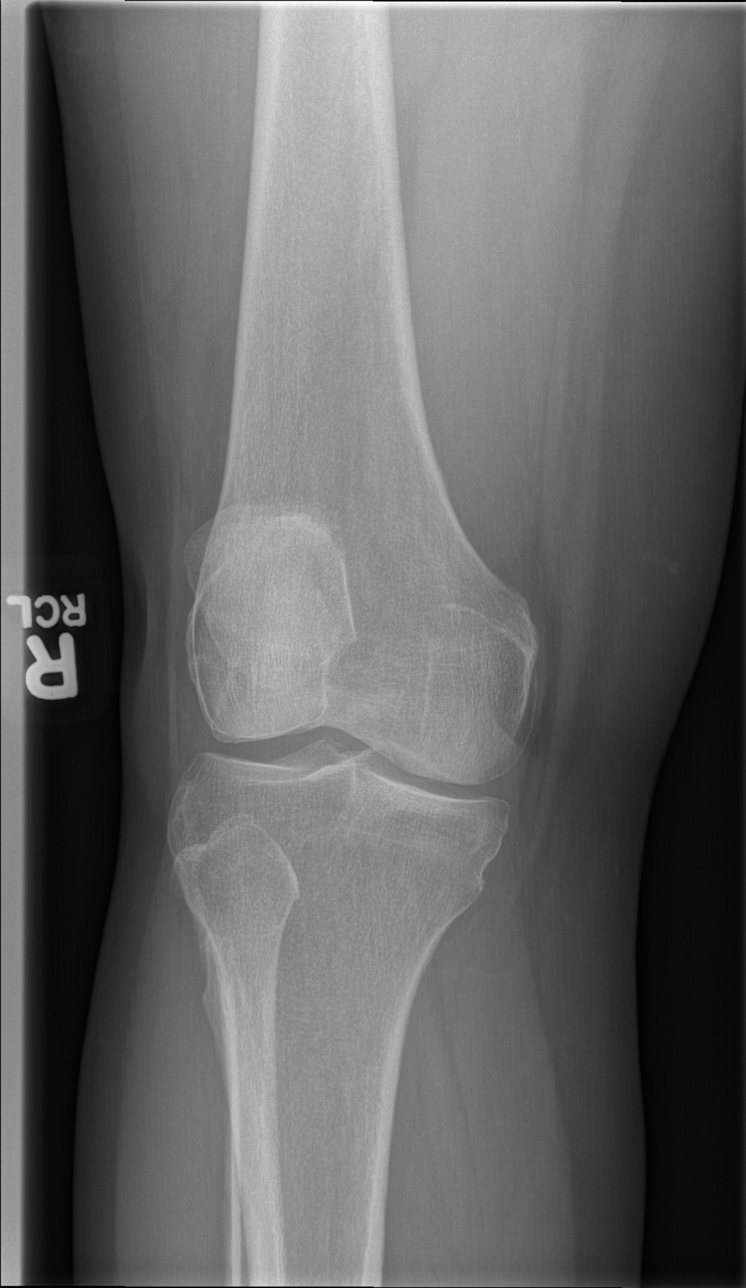
[im 4/4]
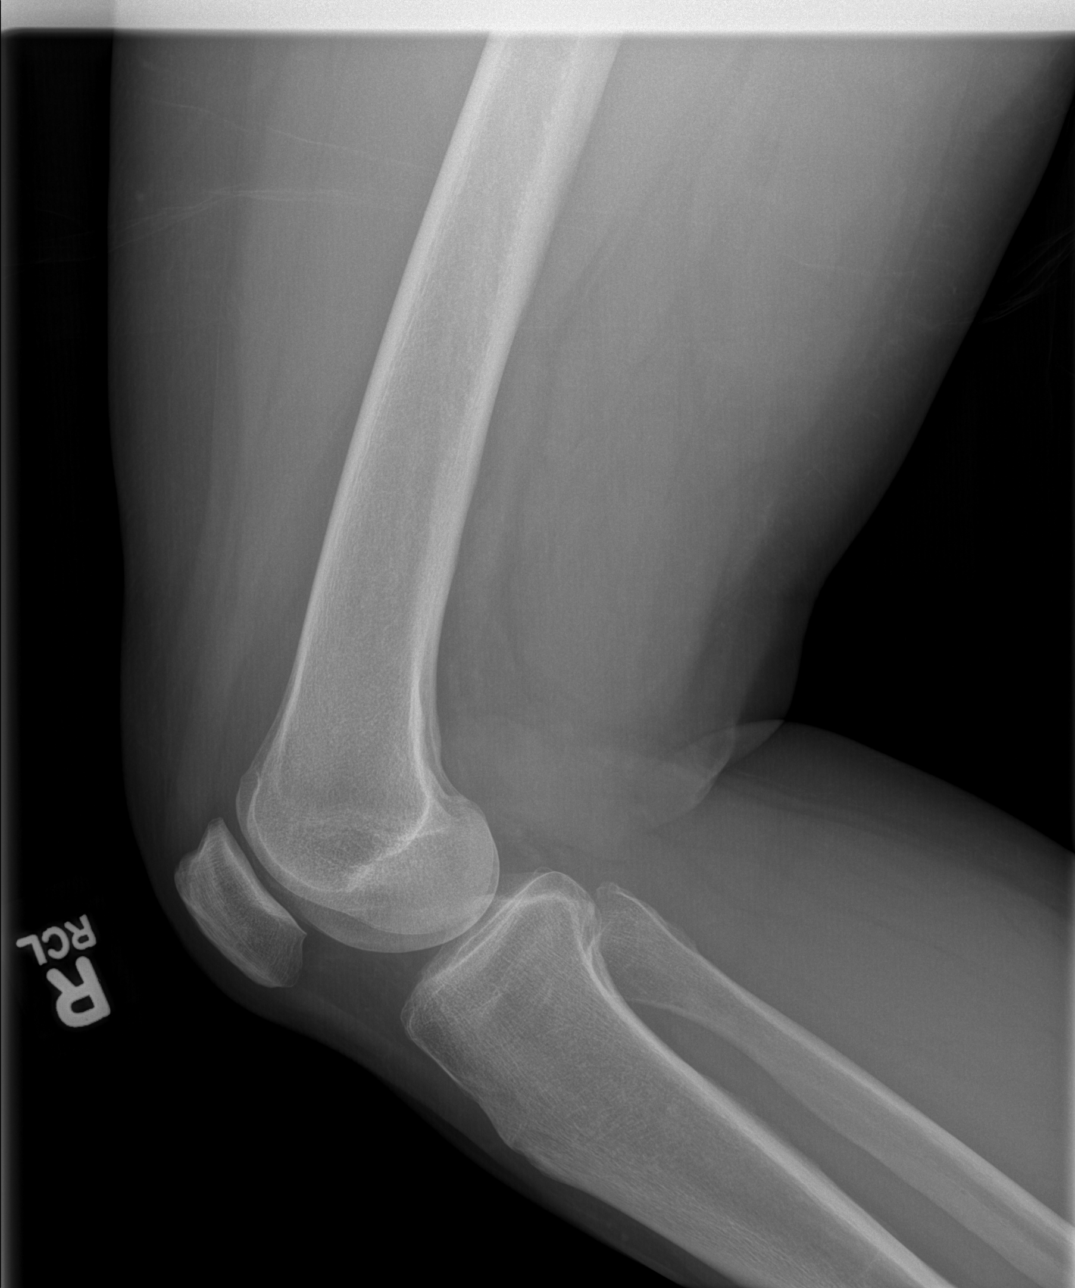

[4 of 4 positions shown; findings below may reference images not displayed]

FINDINGS: No fracture. No bone lesion. Knee joint is normally spaced and
aligned. No significant arthropathic change. No joint effusion.

Soft tissues are unremarkable.
IMPRESSION: Negative.

## 2015-08-20 DIAGNOSIS — R7303 Prediabetes: Secondary | ICD-10-CM | POA: Diagnosis present

## 2015-08-26 ENCOUNTER — Emergency Department
Admission: EM | Admit: 2015-08-26 | Discharge: 2015-08-26 | Disposition: A | Discharge status: Home / Self Care | Payer: BLUE CROSS/BLUE SHIELD | Attending: Emergency Medicine | Admitting: Emergency Medicine

## 2015-08-26 ENCOUNTER — Encounter: Payer: Self-pay | Admitting: Emergency Medicine

## 2015-08-26 DIAGNOSIS — F329 Major depressive disorder, single episode, unspecified: Secondary | ICD-10-CM | POA: Diagnosis not present

## 2015-08-26 DIAGNOSIS — F419 Anxiety disorder, unspecified: Secondary | ICD-10-CM

## 2015-08-26 DIAGNOSIS — N390 Urinary tract infection, site not specified: Secondary | ICD-10-CM | POA: Diagnosis not present

## 2015-08-26 DIAGNOSIS — F32A Depression, unspecified: Secondary | ICD-10-CM

## 2015-08-26 DIAGNOSIS — R531 Weakness: Secondary | ICD-10-CM | POA: Diagnosis not present

## 2015-08-26 DIAGNOSIS — R05 Cough: Secondary | ICD-10-CM | POA: Diagnosis not present

## 2015-08-26 DIAGNOSIS — R110 Nausea: Secondary | ICD-10-CM | POA: Insufficient documentation

## 2015-08-26 HISTORY — DX: Unspecified osteoarthritis, unspecified site: M19.90

## 2015-08-26 HISTORY — DX: Other intervertebral disc displacement, lumbar region: M51.26

## 2015-08-26 HISTORY — DX: Anxiety disorder, unspecified: F41.9

## 2015-08-26 HISTORY — DX: Fibromyalgia: M79.7

## 2015-08-26 LAB — CBC WITH DIFFERENTIAL/PLATELET
BASOS PCT: 1 %
Basophils Absolute: 0.1 10*3/uL (ref 0–0.1)
EOS PCT: 0 %
Eosinophils Absolute: 0 10*3/uL (ref 0–0.7)
HEMATOCRIT: 43 % (ref 35.0–47.0)
Hemoglobin: 14.6 g/dL (ref 12.0–16.0)
LYMPHS PCT: 17 %
Lymphs Abs: 1.6 10*3/uL (ref 1.0–3.6)
MCH: 28.4 pg (ref 26.0–34.0)
MCHC: 34 g/dL (ref 32.0–36.0)
MCV: 83.5 fL (ref 80.0–100.0)
MONO ABS: 0.5 10*3/uL (ref 0.2–0.9)
MONOS PCT: 5 %
NEUTROS ABS: 7.7 10*3/uL — AB (ref 1.4–6.5)
Neutrophils Relative %: 77 %
PLATELETS: 350 10*3/uL (ref 150–440)
RBC: 5.14 MIL/uL (ref 3.80–5.20)
RDW: 13.8 % (ref 11.5–14.5)
WBC: 9.9 10*3/uL (ref 3.6–11.0)

## 2015-08-26 LAB — URINE DRUG SCREEN, QUALITATIVE (ARMC ONLY)
Amphetamines, Ur Screen: NOT DETECTED
BENZODIAZEPINE, UR SCRN: NOT DETECTED
Barbiturates, Ur Screen: NOT DETECTED
CANNABINOID 50 NG, UR ~~LOC~~: NOT DETECTED
Cocaine Metabolite,Ur ~~LOC~~: NOT DETECTED
MDMA (ECSTASY) UR SCREEN: NOT DETECTED
Methadone Scn, Ur: NOT DETECTED
Opiate, Ur Screen: NOT DETECTED
PHENCYCLIDINE (PCP) UR S: NOT DETECTED
Tricyclic, Ur Screen: NOT DETECTED

## 2015-08-26 LAB — COMPREHENSIVE METABOLIC PANEL
ALT: 29 U/L (ref 14–54)
ANION GAP: 8 (ref 5–15)
AST: 27 U/L (ref 15–41)
Albumin: 4.9 g/dL (ref 3.5–5.0)
Alkaline Phosphatase: 66 U/L (ref 38–126)
BILIRUBIN TOTAL: 0.9 mg/dL (ref 0.3–1.2)
BUN: 11 mg/dL (ref 6–20)
CO2: 25 mmol/L (ref 22–32)
Calcium: 9.7 mg/dL (ref 8.9–10.3)
Chloride: 106 mmol/L (ref 101–111)
Creatinine, Ser: 0.71 mg/dL (ref 0.44–1.00)
GFR calc Af Amer: >60 mL/min (ref >60)
Glucose, Bld: 135 mg/dL — ABNORMAL HIGH (ref 65–99)
POTASSIUM: 3.4 mmol/L — AB (ref 3.5–5.1)
Sodium: 139 mmol/L (ref 135–145)
TOTAL PROTEIN: 7.9 g/dL (ref 6.5–8.1)

## 2015-08-26 LAB — URINALYSIS COMPLETE WITH MICROSCOPIC (ARMC ONLY)
Bilirubin Urine: NEGATIVE
Glucose, UA: NEGATIVE mg/dL
HGB URINE DIPSTICK: NEGATIVE
LEUKOCYTES UA: NEGATIVE
NITRITE: POSITIVE — AB
PH: 7 (ref 5.0–8.0)
PROTEIN: NEGATIVE mg/dL
SPECIFIC GRAVITY, URINE: 1.003 — AB (ref 1.005–1.030)

## 2015-08-26 LAB — TROPONIN I: Troponin I: <0.03 ng/mL (ref <0.031)

## 2015-08-26 LAB — ETHANOL: Alcohol, Ethyl (B): <5 mg/dL (ref <5)

## 2015-08-26 LAB — ACETAMINOPHEN LEVEL

## 2015-08-26 LAB — SALICYLATE LEVEL: Salicylate Lvl: <4 mg/dL (ref 2.8–30.0)

## 2015-08-26 MED ORDER — CITALOPRAM HYDROBROMIDE 20 MG PO TABS
10.0000 mg | ORAL_TABLET | Freq: Every day | ORAL | Status: DC
  Administered 2015-08-26: 10 mg via ORAL
  Filled 2015-08-26: qty 1

## 2015-08-26 MED ORDER — CITALOPRAM HYDROBROMIDE 10 MG PO TABS: 10.0000 mg | ORAL_TABLET | Freq: Every day | ORAL | Status: DC

## 2015-08-26 MED ORDER — CEPHALEXIN 500 MG PO CAPS: 500.0000 mg | ORAL_CAPSULE | Freq: Three times a day (TID) | ORAL | Status: AC

## 2015-08-26 MED ORDER — CEPHALEXIN 500 MG PO CAPS
500.0000 mg | ORAL_CAPSULE | Freq: Once | ORAL | Status: AC
  Administered 2015-08-26: 500 mg via ORAL
  Filled 2015-08-26: qty 1

## 2015-08-26 NOTE — ED Notes (Addendum)
[facility]. States she was taking Cymbalta and had 90 day supply filled. [facility] ceased doing primary care. States she called back and they gave her one month supply that she used to wean herself off of Cymbalta. Patient states anxiety has been terrible last couple of days, has not been able to eat or sleep. Had appointment with her new doctor this past Monday. Saw urgent care yesterday for low back pain, urinalysis not resulted yet. +Weakness.

## 2015-08-26 NOTE — ED Provider Notes (Addendum)
[facility] Emergency Department Provider Note  ____________________________________________  Time seen: Approximately 11 AM  I have reviewed the triage vital signs and the nursing notes.   HISTORY  Chief Complaint Anxiety; Weakness; and Nausea    HPI April Lindsey is a 59 y.o. female with a history of anxiety and fibromyalgia who is presenting today for 2 days of worsening weakness nausea and anxiety. She says that she has been off her Cymbalta since this past May and ever since then she has been having recurrent similar symptoms to what she has presented with today. She says that she is drinking plenty of water and has not vomited. She says that she has also had increased depression but no thoughts of hurting herself or killing herself or anybody else. She says that she has had intermittent back pain over the past 2 days but she is also had this chronically and has a history of herniated disks. Denies any back pain at this time. Did not report any bowel or bladder incontinence. Did not report any focal weakness. Her weakness is generalized.Says that due to her anxiety she has been unable to sleep over the past 2 nights and is just up pacing.   Past Medical History  Diagnosis Date  . Anxiety   . Herniated lumbar intervertebral disc   . Fibromyalgia   . Arthritis     There are no active problems to display for this patient.   Past Surgical History  Procedure Laterality Date  . Rectovaginal fistula closure    . Abdominal hysterectomy    . Bilateral carpal tunnel release    . Septoplasty      No current outpatient prescriptions on file.  Allergies Codeine; Morphine and related; Penicillins; Erythromycin; and Tetracyclines & related  No family history on file.  Social History Social History  Substance Use Topics  . Smoking status: Never Smoker   . Smokeless tobacco: None  . Alcohol Use: No    Review of Systems Constitutional: No  fever/chills Eyes: No visual changes. ENT: No sore throat. Cardiovascular: Denies chest pain. Respiratory: Denies shortness of breath. Gastrointestinal: No abdominal pain.  no vomiting.  No diarrhea.  No constipation. Genitourinary: Negative for dysuria. Musculoskeletal: Chronic and mild low back pain. Skin: Negative for rash. Neurological: Negative for headaches, focal weakness or numbness.  10-point ROS otherwise negative.  ____________________________________________   PHYSICAL EXAM:  VITAL SIGNS: ED Triage Vitals  Enc Vitals Group     BP 08/26/15 1002 143/94 mmHg     Pulse Rate 08/26/15 1002 113     Resp 08/26/15 1002 20     Temp 08/26/15 1002 98.6 F (37 C)     Temp Source 08/26/15 1002 Oral     SpO2 08/26/15 1002 94 %     Weight 08/26/15 1004 157 lb (71.215 kg)     Height 08/26/15 1004 5\' 3"  (1.6 m)     Head Cir --      Peak Flow --      Pain Score 08/26/15 1004 0     Pain Loc --      Pain Edu? --      Excl. in GC? --     Constitutional: Alert and oriented. Well appearing and in no acute distress. Eyes: Conjunctivae are normal. PERRL. EOMI. Head: Atraumatic. Nose: No congestion/rhinnorhea. Mouth/Throat: Mucous membranes are moist.  Oropharynx non-erythematous. Neck: No stridor.   Cardiovascular: Normal rate, regular rhythm. Grossly normal heart sounds.  Good peripheral circulation. Respiratory:  Normal respiratory effort.  No retractions. Lungs CTAB. Gastrointestinal: Soft and nontender. No distention. No abdominal bruits. No CVA tenderness. Musculoskeletal: No lower extremity tenderness nor edema.  No joint effusions. No tenderness palpation to the T and L spines or lumbar regions.  Neurologic:  Normal speech and language. No gross focal neurologic deficits are appreciated. No gait instability. Skin:  Skin is warm, dry and intact. No rash noted. Psychiatric: Mood and affect are normal. Speech and behavior are normal. The patient does back and forth throughout  my interview and exam.  ____________________________________________   LABS (all labs ordered are listed, but only abnormal results are displayed)  Labs Reviewed  CBC WITH DIFFERENTIAL/PLATELET - Abnormal; Notable for the following:    Neutro Abs 7.7 (*)    All other components within normal limits  COMPREHENSIVE METABOLIC PANEL - Abnormal; Notable for the following:    Potassium 3.4 (*)    Glucose, Bld 135 (*)    All other components within normal limits  URINALYSIS COMPLETEWITH MICROSCOPIC ([facility] ONLY) - Abnormal; Notable for the following:    Color, Urine AMBER (*)    APPearance CLEAR (*)    Ketones, ur TRACE (*)    Specific Gravity, Urine 1.003 (*)    Nitrite POSITIVE (*)    Bacteria, UA RARE (*)    Squamous Epithelial / LPF 0-5 (*)    All other components within normal limits  ACETAMINOPHEN LEVEL - Abnormal; Notable for the following:    Acetaminophen (Tylenol), Serum <10 (*)    All other components within normal limits  URINE CULTURE  URINE DRUG SCREEN, QUALITATIVE ([facility] ONLY)  SALICYLATE LEVEL  ETHANOL  TROPONIN I   ____________________________________________  EKG  ED ECG REPORT I, Arelia Longest, the attending physician, personally viewed and interpreted this ECG.   Date: 08/26/2015  EKG Time: 1116  Rate: 100  Rhythm: sinus tachycardia  Axis: Left axis deviation  Intervals:none  ST&T Change: No ST segment elevation or depression. No abnormal T-wave inversion.  ____________________________________________  RADIOLOGY   ____________________________________________   PROCEDURES  ____________________________________________   INITIAL IMPRESSION / ASSESSMENT AND PLAN / ED COURSE  Pertinent labs & imaging results that were available during my care of the patient were reviewed by me and considered in my medical decision making (see chart for details).  ----------------------------------------- 12:45 PM on  08/26/2015 ----------------------------------------- Discussed the case with Dr.Hulkower , the [facility] psychiatry consultant, who recommends restarting the patient's Celexa 10 mg by mouth. He agrees that the patient is not suicidal or homicidal at this time. Discussed the plan with the patient as well as diagnosis of UTI. She will also be discharged on Keflex. She will follow up with her primary care doctor. She understands the plan is going to comply. Her husband was also at the bedside for these vaccinations. Additionally, when the patient takes deep breaths heart rate goes down to 96. I believe that her elevated heart rate is likely secondary to her anxiety. Continues to deny any chest pain or shortness of breath. No tenderness to palpation of the abdomen.  ____________________________________________   FINAL CLINICAL IMPRESSION(S) / ED DIAGNOSES  Depression and anxiety. UTI.    Myrna Blazer, MD 08/26/15 1257  Myrna Blazer, MD 08/26/15 [phone]

## 2015-08-26 NOTE — ED Notes (Signed)
Discussed discharge instructions, prescriptions, and follow-up care with patient. No questions or concerns at this time. Pt stable at discharge.  

## 2015-08-29 LAB — URINE CULTURE

## 2016-01-23 ENCOUNTER — Other Ambulatory Visit: Payer: Self-pay | Admitting: Internal Medicine

## 2016-01-23 DIAGNOSIS — Z1239 Encounter for other screening for malignant neoplasm of breast: Secondary | ICD-10-CM

## 2016-02-12 ENCOUNTER — Ambulatory Visit: Payer: BLUE CROSS/BLUE SHIELD

## 2016-02-26 ENCOUNTER — Other Ambulatory Visit: Payer: Self-pay | Admitting: Internal Medicine

## 2016-02-26 ENCOUNTER — Ambulatory Visit
Admission: RE | Admit: 2016-02-26 | Discharge: 2016-02-26 | Disposition: A | Discharge status: Home / Self Care | Payer: BLUE CROSS/BLUE SHIELD | Source: Ambulatory Visit | Attending: Internal Medicine | Admitting: Internal Medicine

## 2016-02-26 DIAGNOSIS — Z1231 Encounter for screening mammogram for malignant neoplasm of breast: Secondary | ICD-10-CM | POA: Diagnosis not present

## 2016-02-26 DIAGNOSIS — Z1239 Encounter for other screening for malignant neoplasm of breast: Secondary | ICD-10-CM

## 2016-02-26 IMAGING — MG MM DIGITAL SCREENING BILAT W/ TOMO W/ CAD
8 of 12 series · 8 of 28 positions shown · non-contrast
Comparison: Previous exam(s).

CLINICAL DATA: Screening.

EXAM:
2D DIGITAL SCREENING BILATERAL MAMMOGRAM WITH CAD AND ADJUNCT TOMO

[R MLO]
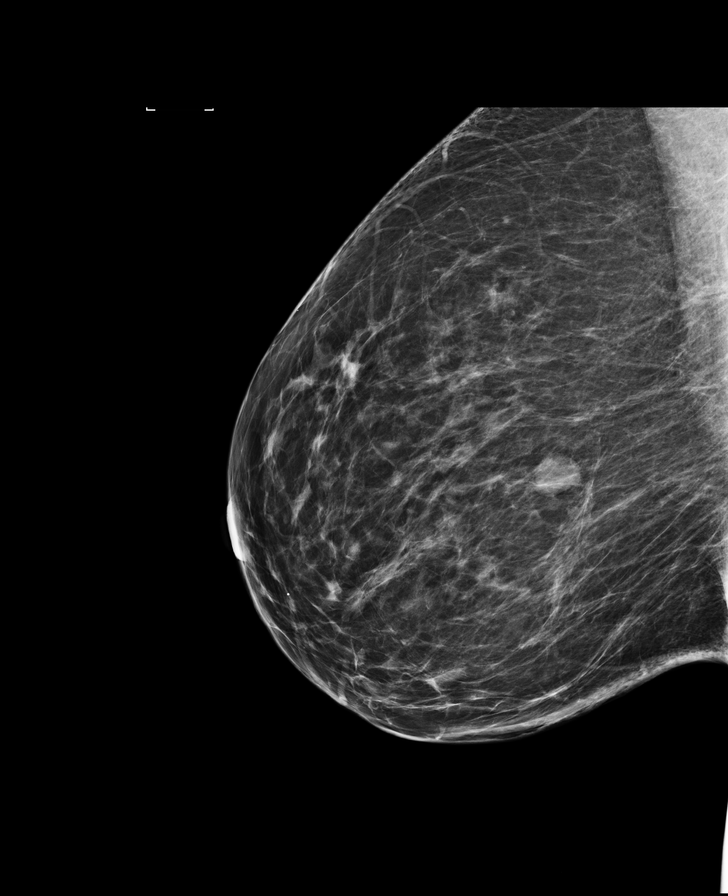

[R CC synth-2D]
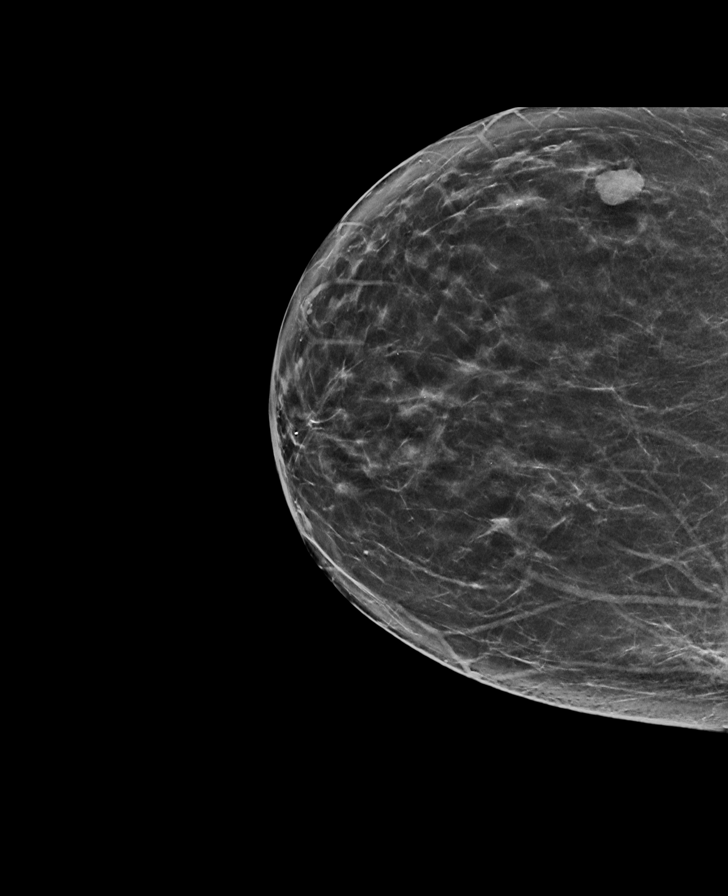

[L MLO]
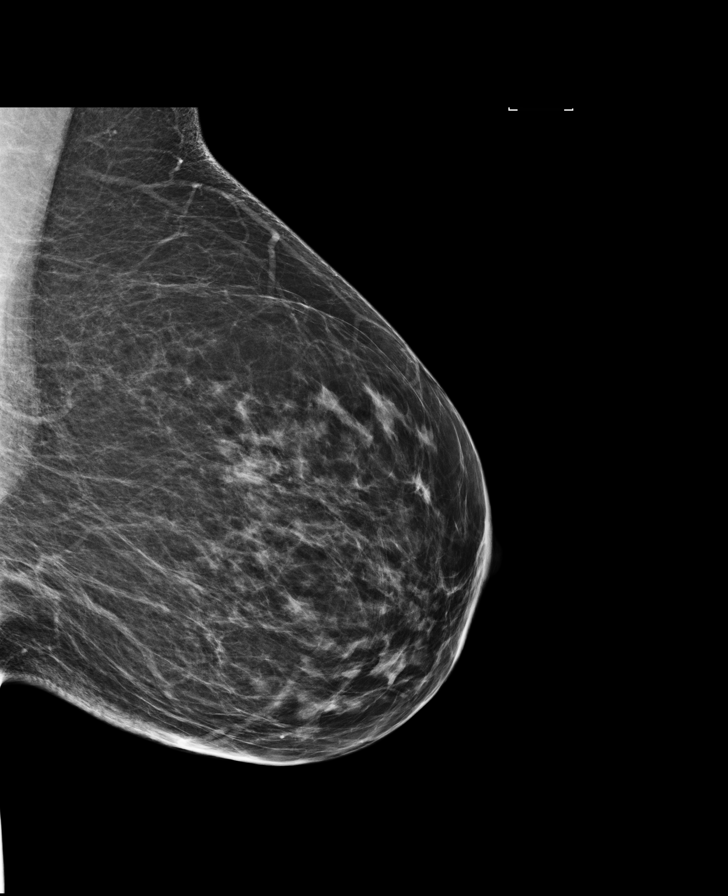

[R MLO synth-2D]
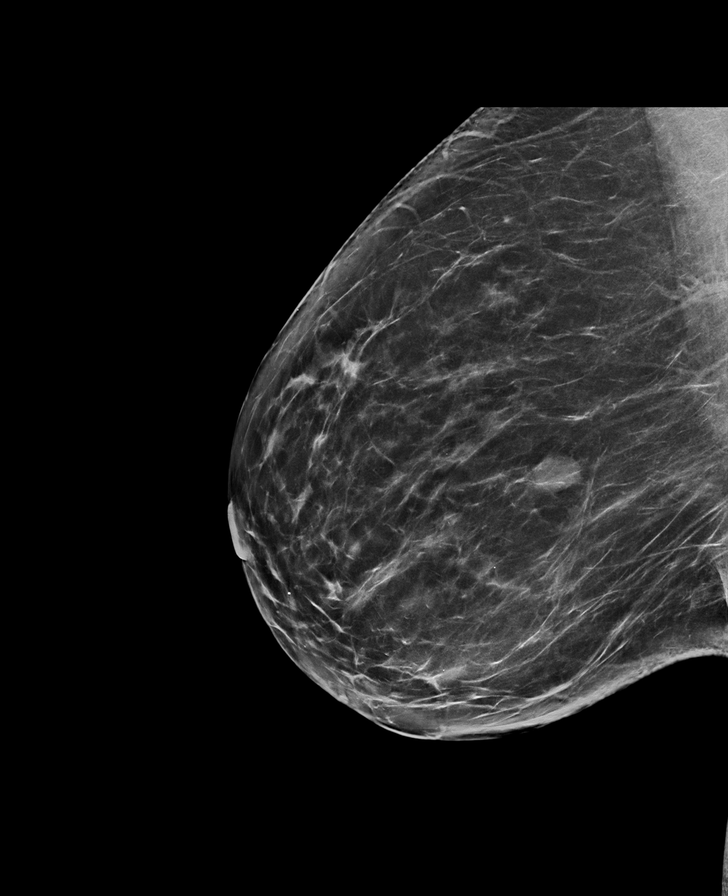

[L MLO synth-2D]
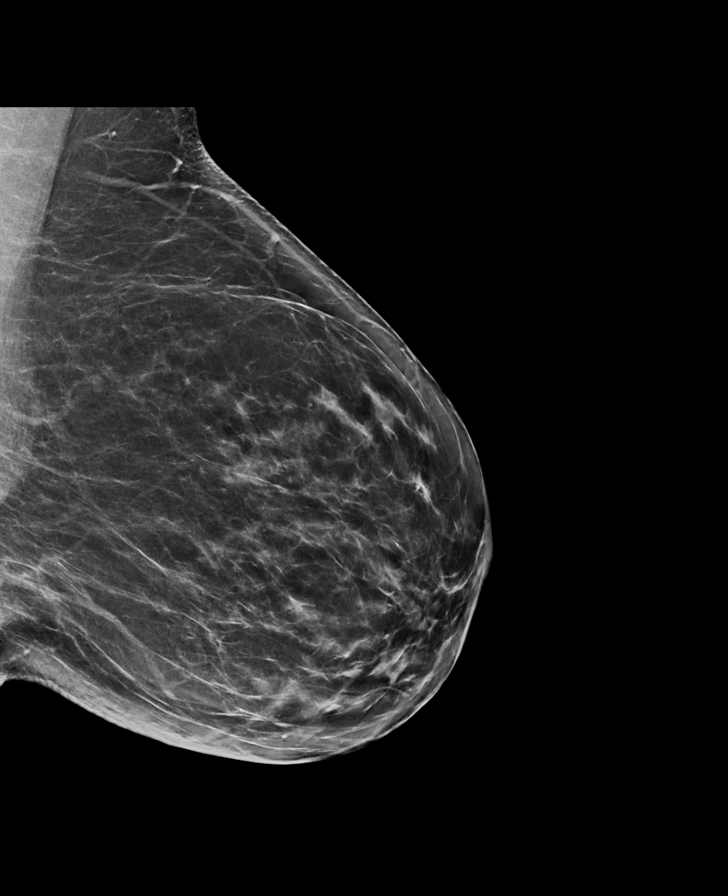

[R CC]
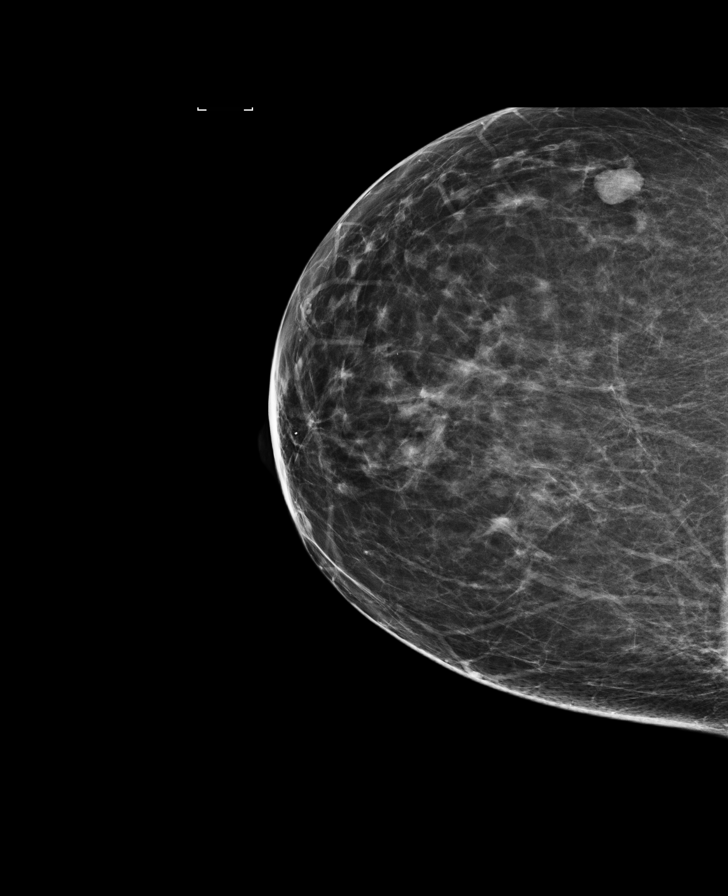

[L CC]
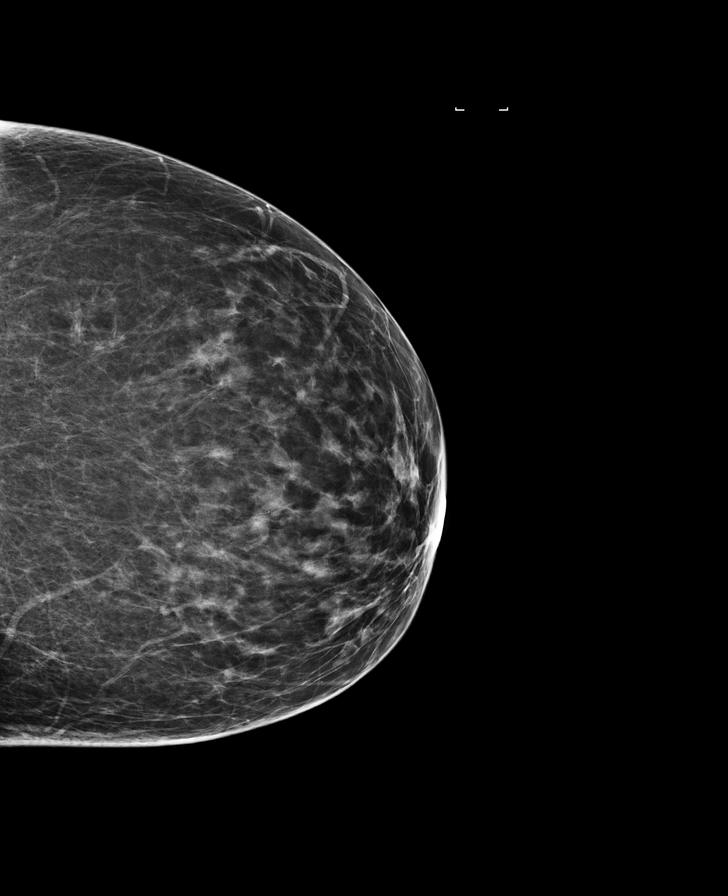

[L CC synth-2D]
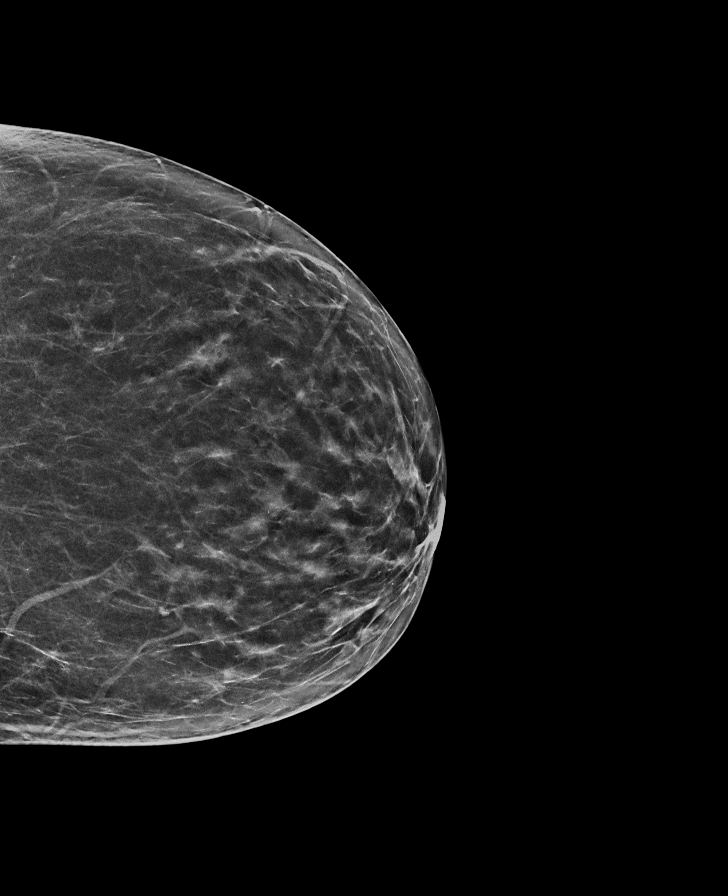

[8 of 28 positions shown; findings below may reference images not displayed]

ACR Breast Density Category b: There are scattered areas of
fibroglandular density.
FINDINGS: There are no findings suspicious for malignancy. Images were
processed with CAD.
IMPRESSION: No mammographic evidence of malignancy. A result letter of this
screening mammogram will be mailed directly to the patient.

RECOMMENDATION:
Screening mammogram in one year. (Code:[33])

BI-RADS CATEGORY  1: Negative.

## 2017-01-23 DIAGNOSIS — F418 Other specified anxiety disorders: Secondary | ICD-10-CM | POA: Diagnosis present

## 2017-01-28 ENCOUNTER — Other Ambulatory Visit: Payer: Self-pay | Admitting: Internal Medicine

## 2017-01-28 DIAGNOSIS — Z1231 Encounter for screening mammogram for malignant neoplasm of breast: Secondary | ICD-10-CM

## 2017-03-10 ENCOUNTER — Ambulatory Visit: Payer: BLUE CROSS/BLUE SHIELD

## 2017-04-21 ENCOUNTER — Ambulatory Visit
Admission: RE | Admit: 2017-04-21 | Discharge: 2017-04-21 | Disposition: A | Discharge status: Home / Self Care | Payer: BLUE CROSS/BLUE SHIELD | Source: Ambulatory Visit | Attending: Internal Medicine | Admitting: Internal Medicine

## 2017-04-21 DIAGNOSIS — Z1231 Encounter for screening mammogram for malignant neoplasm of breast: Secondary | ICD-10-CM | POA: Diagnosis not present

## 2017-04-21 IMAGING — MG MM DIGITAL SCREENING BILAT W/ TOMO W/ CAD
8 of 12 series · 8 of 28 positions shown · non-contrast
Comparison: Previous exam(s).

CLINICAL DATA: Screening.

EXAM:
2D DIGITAL SCREENING BILATERAL MAMMOGRAM WITH CAD AND ADJUNCT TOMO

[L MLO]
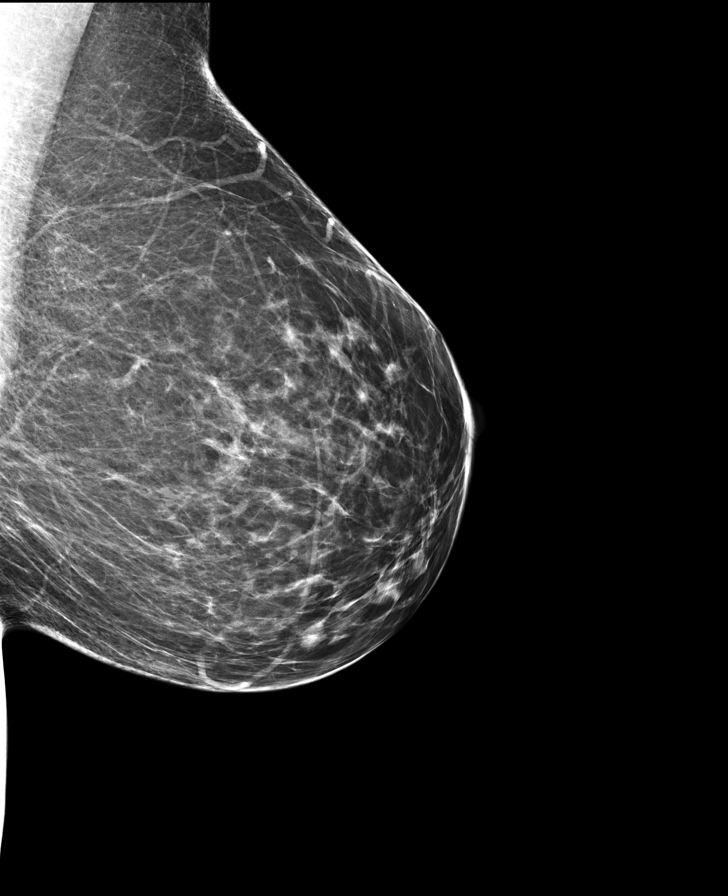

[R MLO synth-2D]
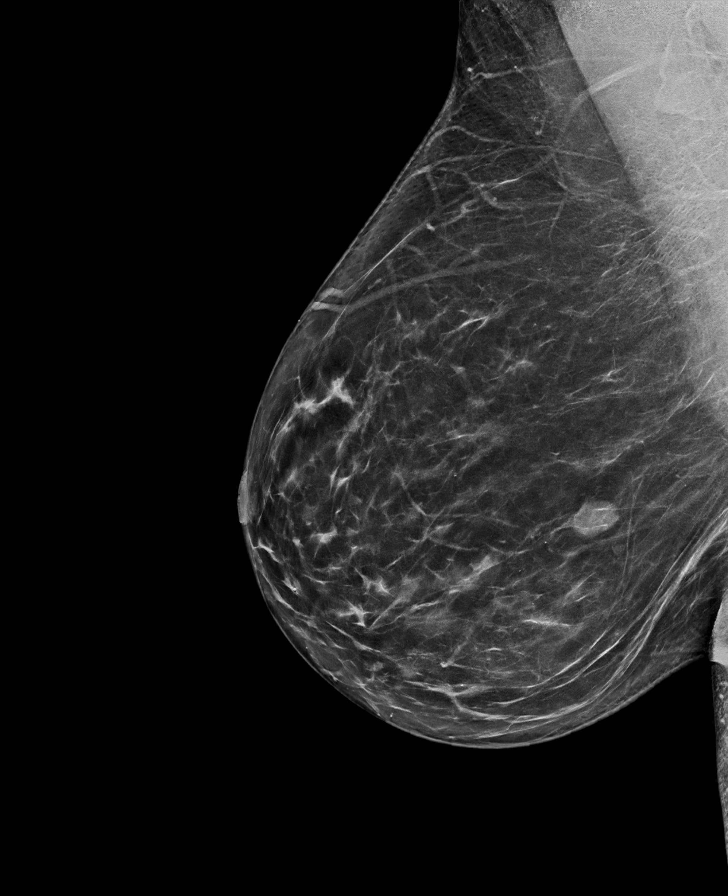

[L CC]
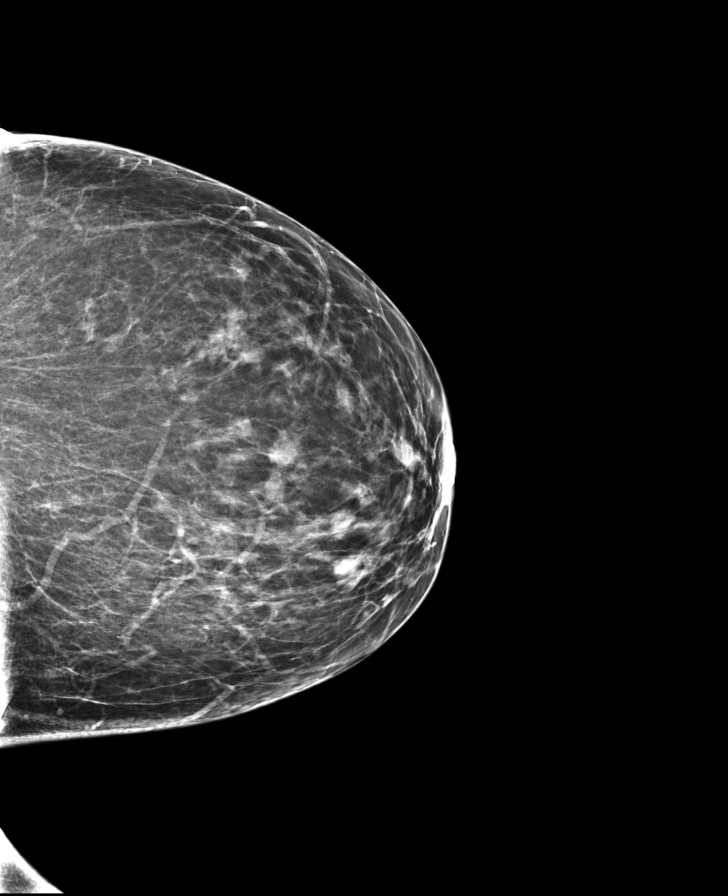

[R MLO]
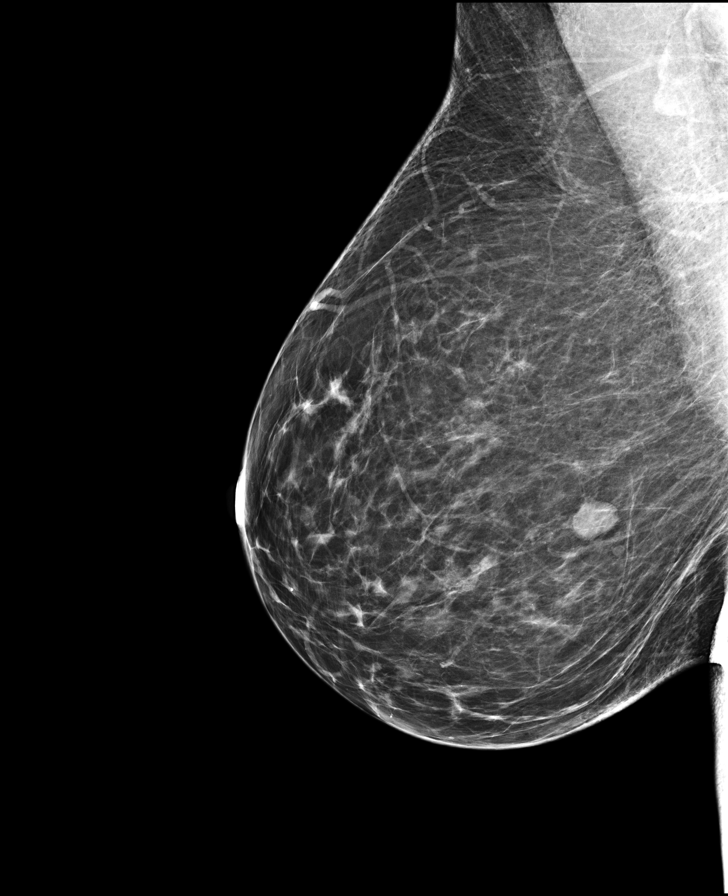

[R CC]
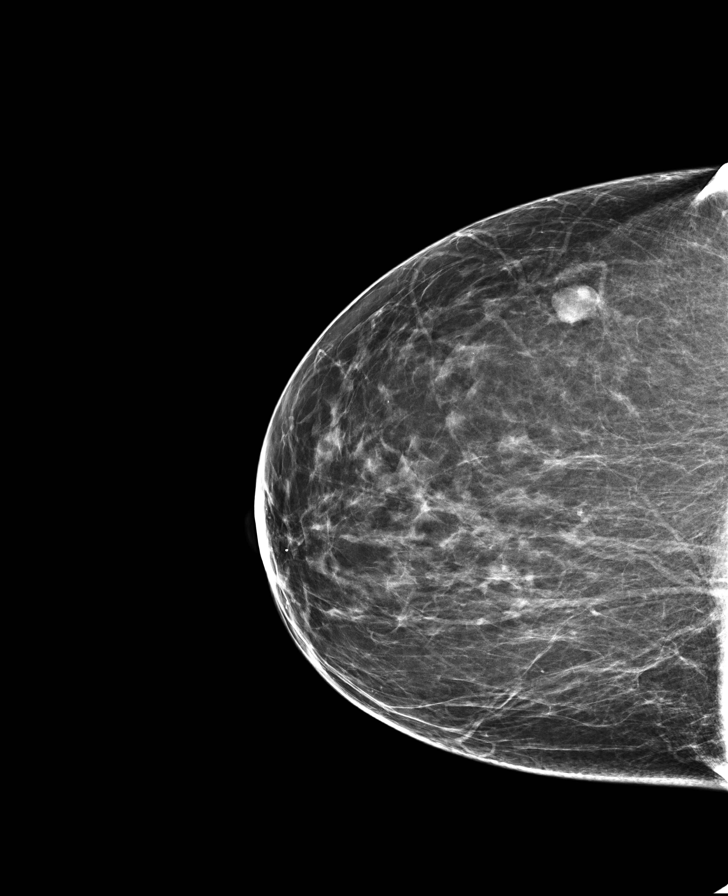

[R CC synth-2D]
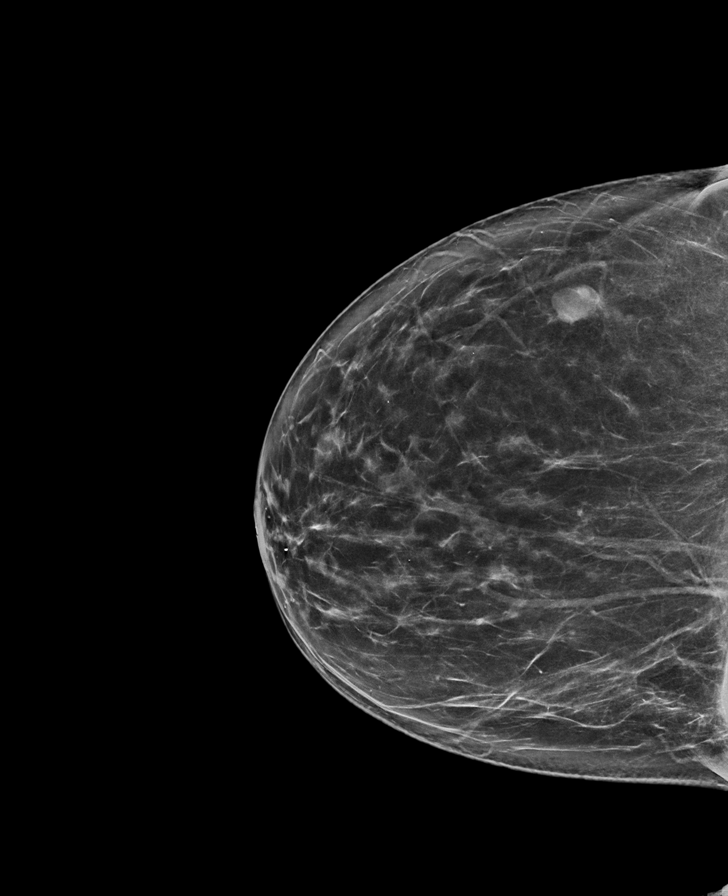

[L CC synth-2D]
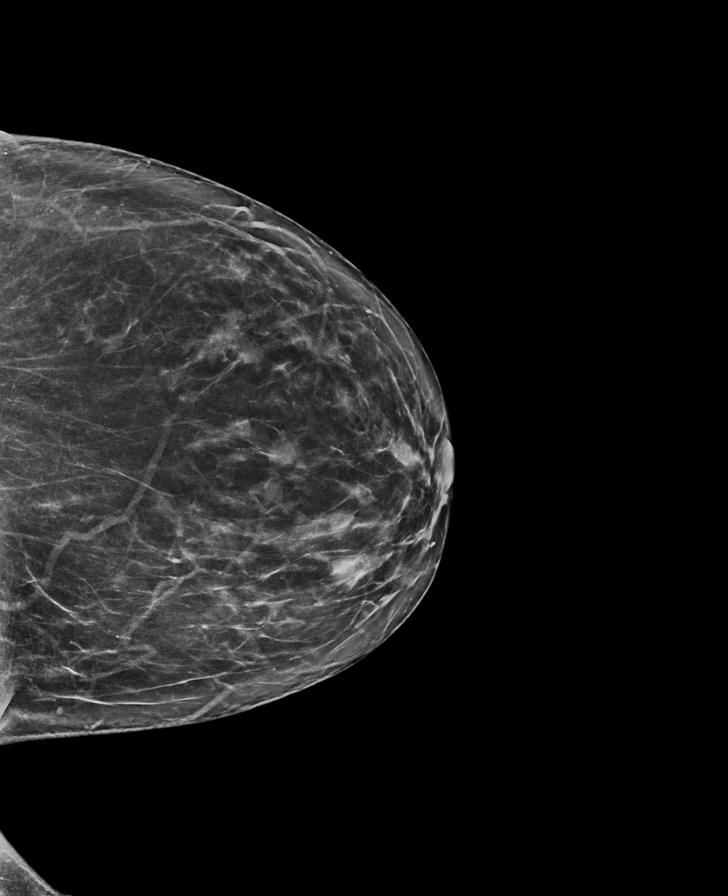

[L MLO synth-2D]
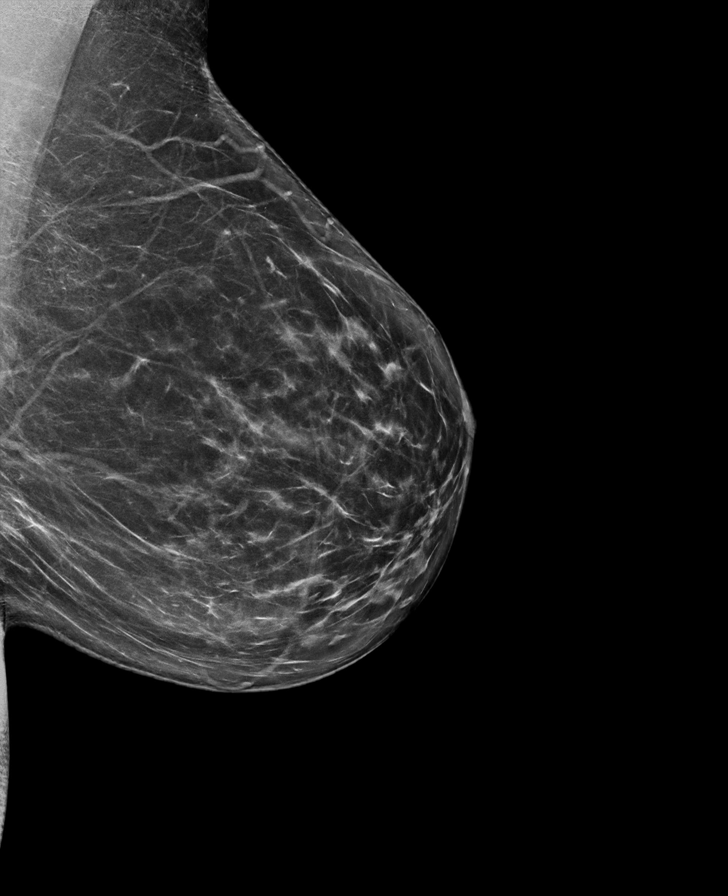

[8 of 28 positions shown; findings below may reference images not displayed]

ACR Breast Density Category b: There are scattered areas of
fibroglandular density.
FINDINGS: There are no findings suspicious for malignancy. Images were
processed with CAD.
IMPRESSION: No mammographic evidence of malignancy. A result letter of this
screening mammogram will be mailed directly to the patient.

RECOMMENDATION:
Screening mammogram in one year. (Code:[33])

BI-RADS CATEGORY  1: Negative.

## 2018-02-18 ENCOUNTER — Other Ambulatory Visit: Payer: Self-pay | Admitting: Internal Medicine

## 2018-02-18 DIAGNOSIS — Z1239 Encounter for other screening for malignant neoplasm of breast: Secondary | ICD-10-CM

## 2018-05-27 ENCOUNTER — Ambulatory Visit
Admission: RE | Admit: 2018-05-27 | Discharge: 2018-05-27 | Disposition: A | Discharge status: Home / Self Care | Payer: BLUE CROSS/BLUE SHIELD | Source: Ambulatory Visit | Attending: Internal Medicine | Admitting: Internal Medicine

## 2018-05-27 DIAGNOSIS — Z1239 Encounter for other screening for malignant neoplasm of breast: Secondary | ICD-10-CM

## 2018-05-27 DIAGNOSIS — Z1231 Encounter for screening mammogram for malignant neoplasm of breast: Secondary | ICD-10-CM | POA: Diagnosis not present

## 2018-05-27 IMAGING — MG MM DIGITAL SCREENING BILAT W/ TOMO W/ CAD
8 series · 8 of 24 positions shown · non-contrast
Comparison: Previous exam(s).

CLINICAL DATA: Screening.

EXAM:
DIGITAL SCREENING BILATERAL MAMMOGRAM WITH TOMO AND CAD

[R MLO synth-2D]
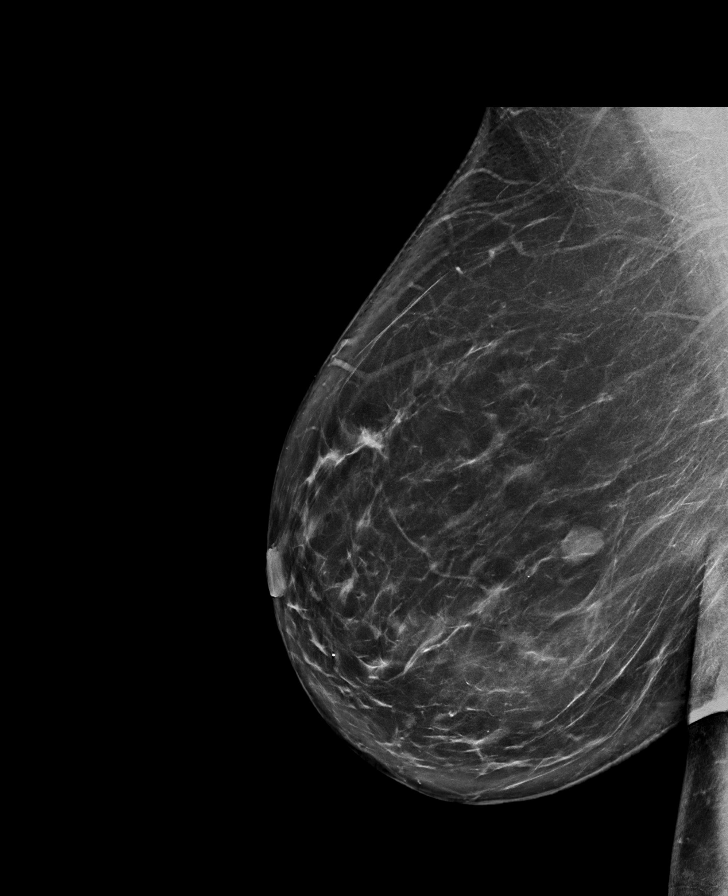

[L CC synth-2D]
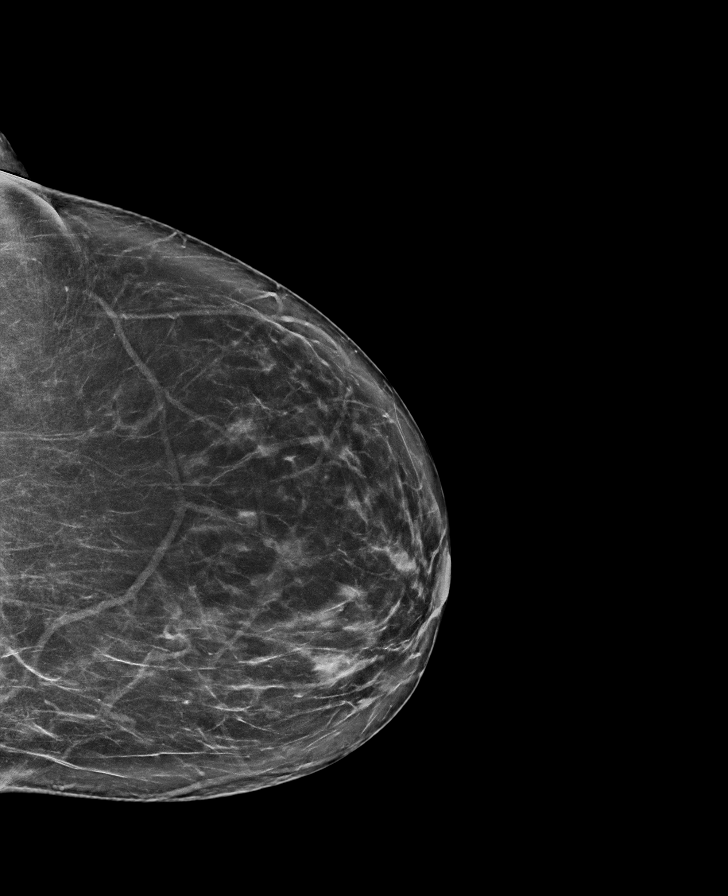

[R CC synth-2D]
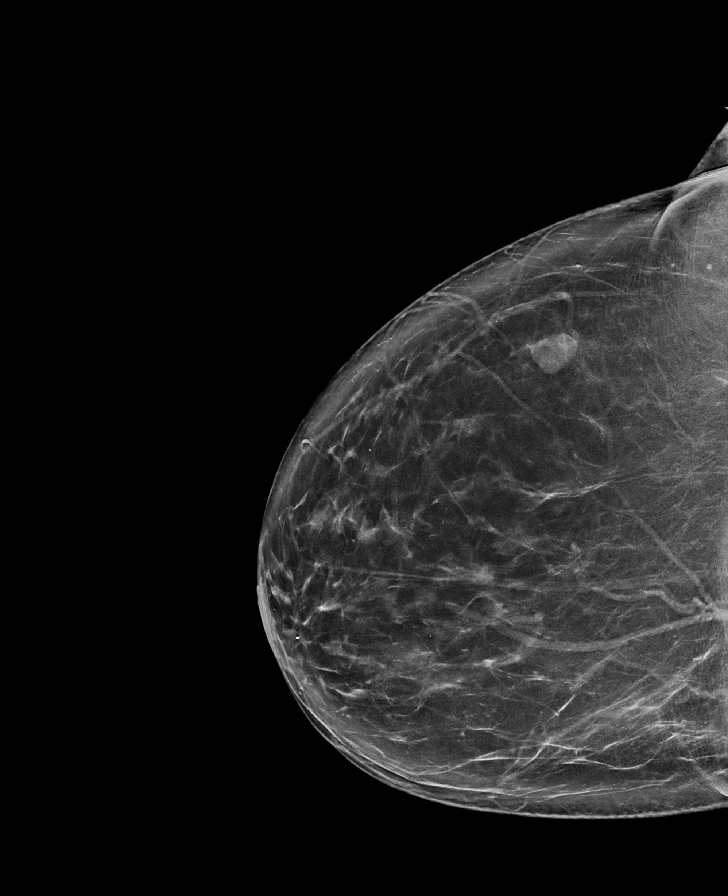

[L MLO synth-2D]
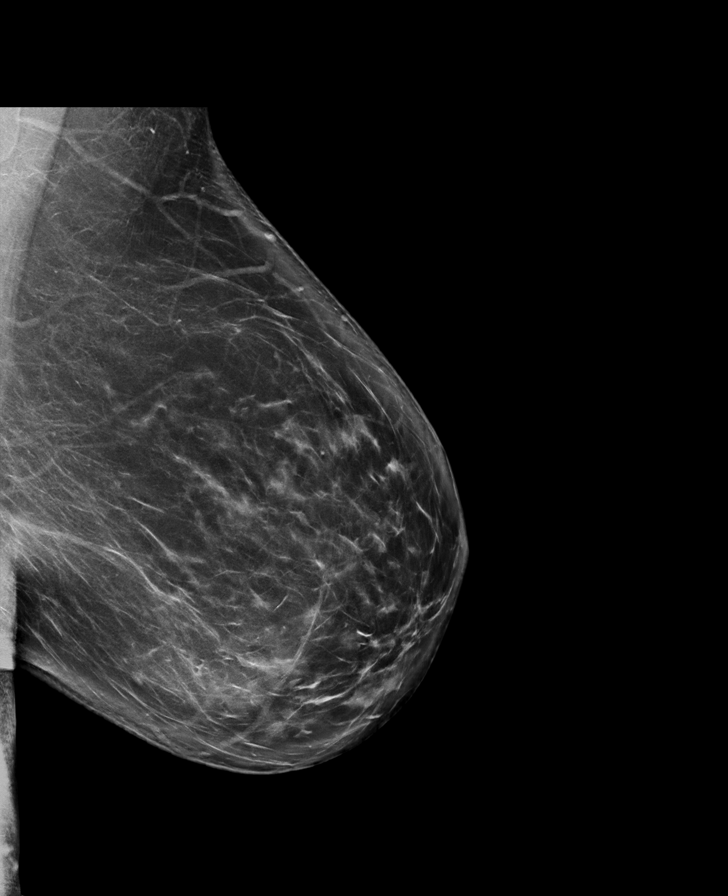

[L MLO tomo · tomo slice 47/92.0]
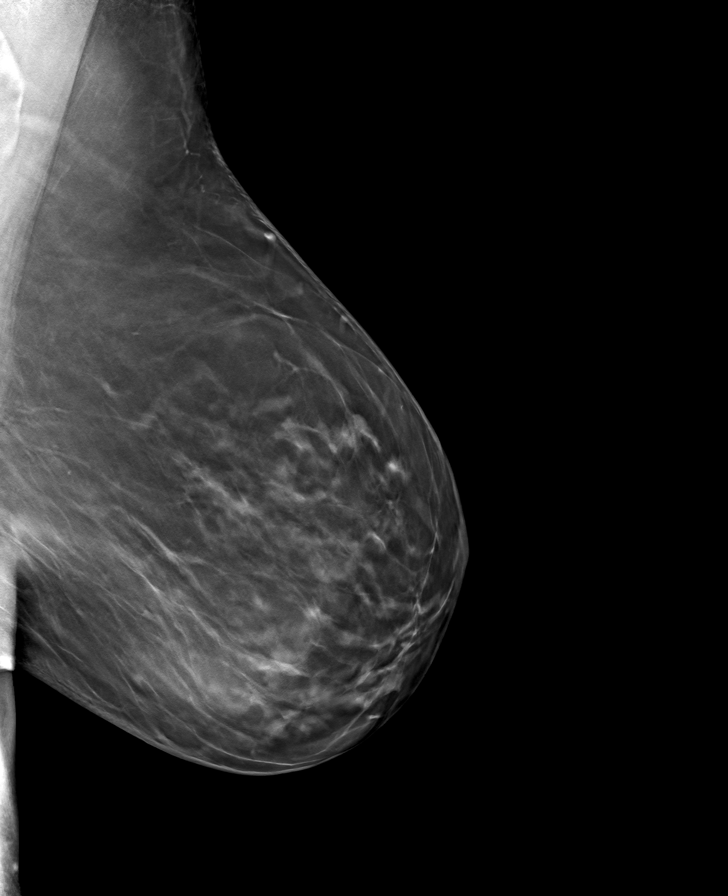

[L CC tomo · tomo slice 43/84.0]
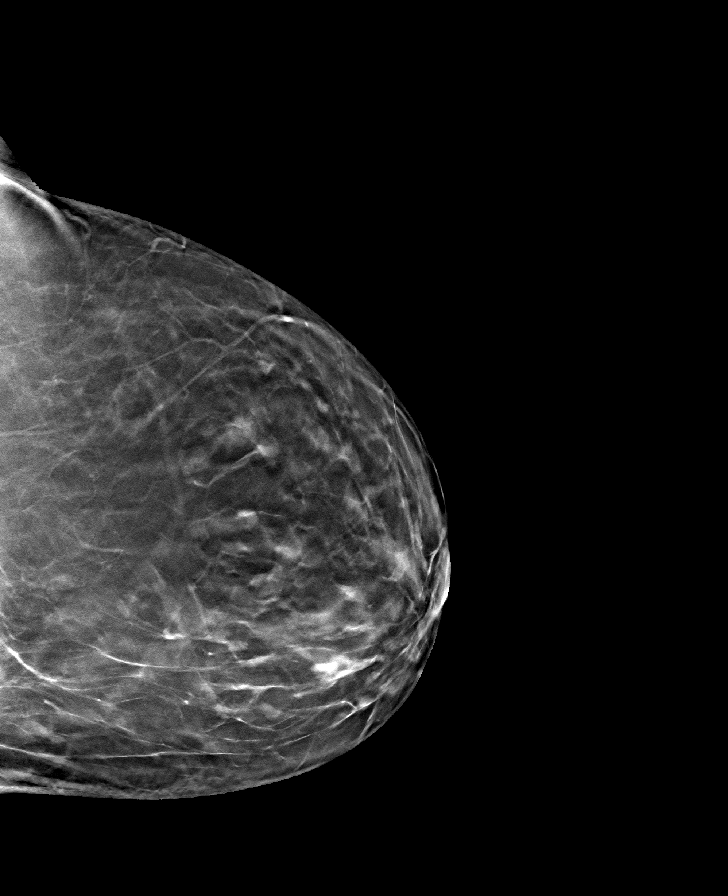

[R MLO tomo · tomo slice 47/92.0]
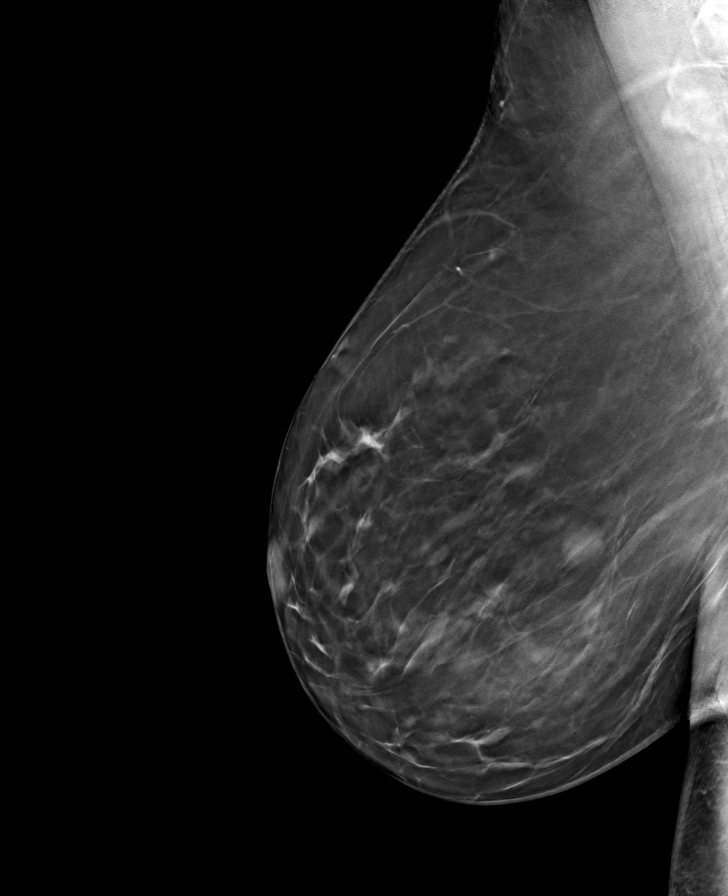

[R CC tomo · tomo slice 45/88.0]
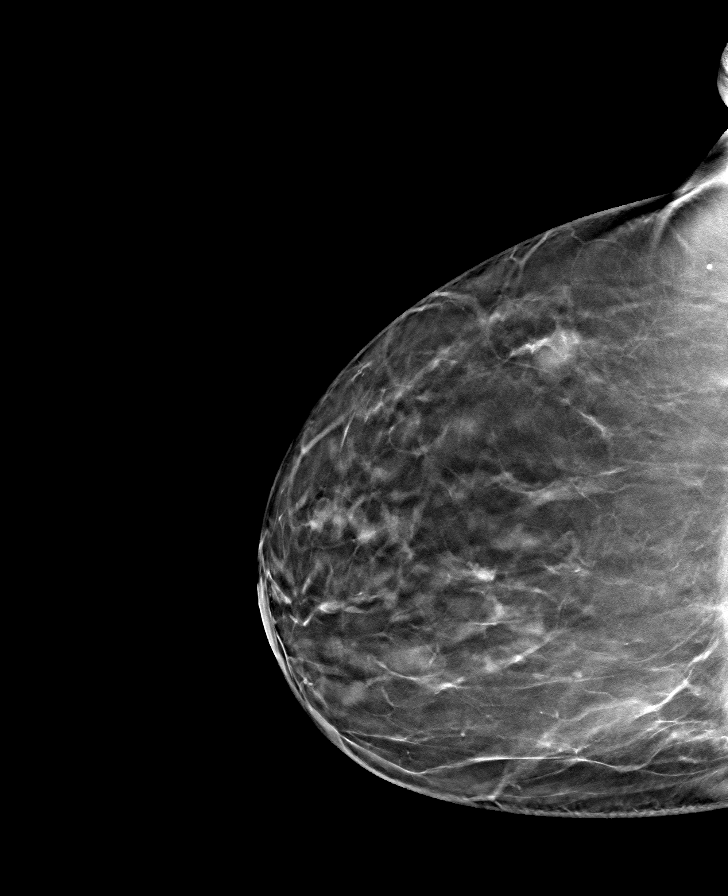

[8 of 24 positions shown; findings below may reference images not displayed]

ACR Breast Density Category b: There are scattered areas of
fibroglandular density.
FINDINGS: There are no findings suspicious for malignancy. Images were
processed with CAD.
IMPRESSION: No mammographic evidence of malignancy. A result letter of this
screening mammogram will be mailed directly to the patient.

RECOMMENDATION:
Screening mammogram in one year. (Code:[TQ])

BI-RADS CATEGORY  1: Negative.

## 2019-03-17 ENCOUNTER — Other Ambulatory Visit: Payer: Self-pay | Admitting: Physician Assistant

## 2019-03-17 DIAGNOSIS — Z1231 Encounter for screening mammogram for malignant neoplasm of breast: Secondary | ICD-10-CM

## 2019-07-06 ENCOUNTER — Ambulatory Visit
Admission: RE | Admit: 2019-07-06 | Discharge: 2019-07-06 | Disposition: A | Discharge status: Home / Self Care | Payer: BC Managed Care – PPO | Source: Ambulatory Visit | Attending: Physician Assistant | Admitting: Physician Assistant

## 2019-07-06 DIAGNOSIS — Z1231 Encounter for screening mammogram for malignant neoplasm of breast: Secondary | ICD-10-CM

## 2019-07-06 IMAGING — MG DIGITAL SCREENING BILAT W/ TOMO W/ CAD
8 series · 8 of 24 positions shown · non-contrast
Comparison: Previous exam(s).

CLINICAL DATA: Screening.

EXAM:
DIGITAL SCREENING BILATERAL MAMMOGRAM WITH TOMO AND CAD

[L CC synth-2D]
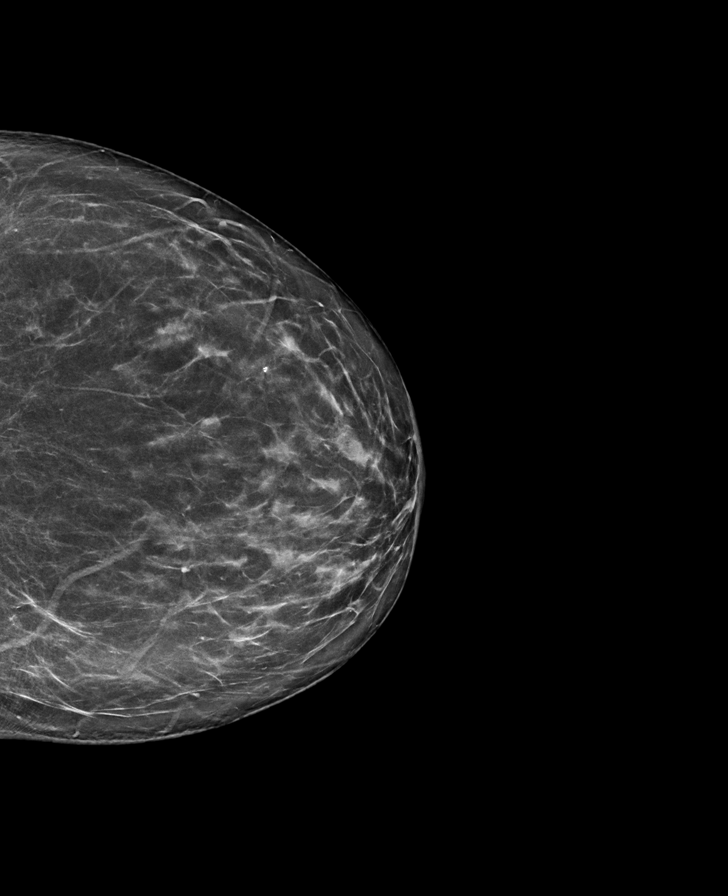

[L MLO synth-2D]
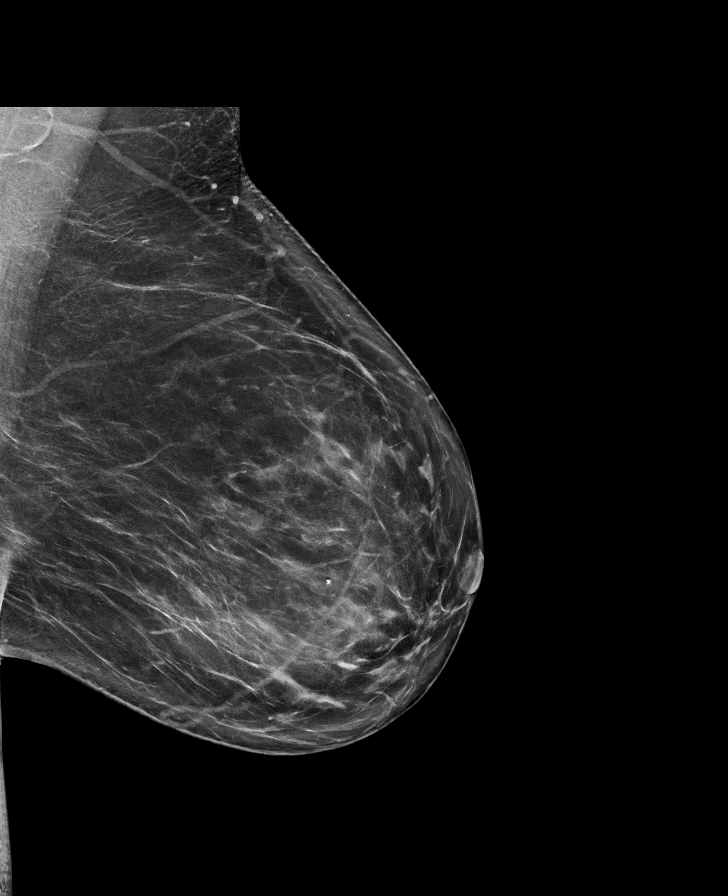

[R MLO synth-2D]
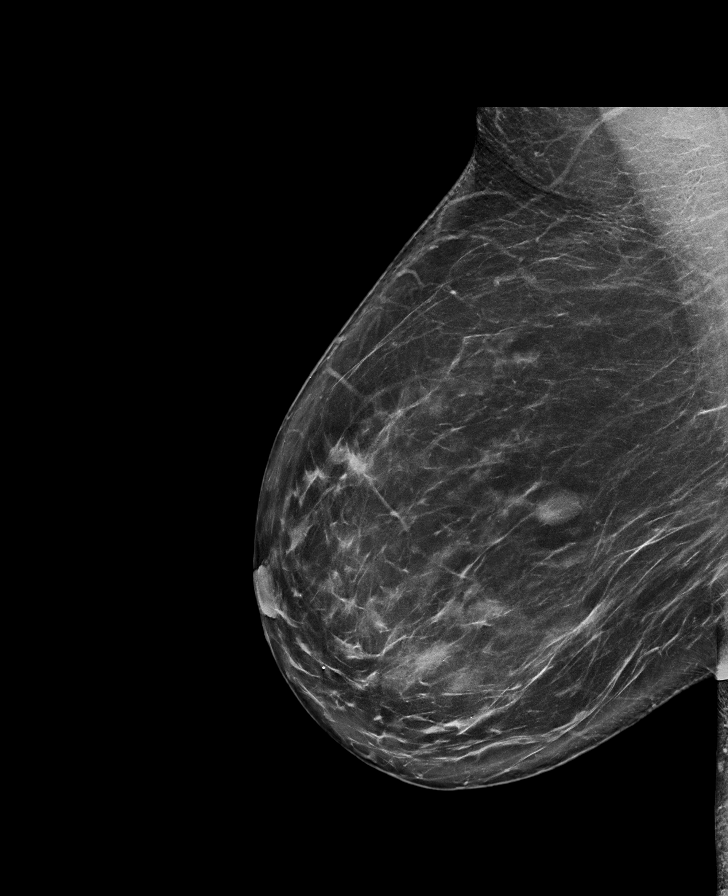

[R CC synth-2D]
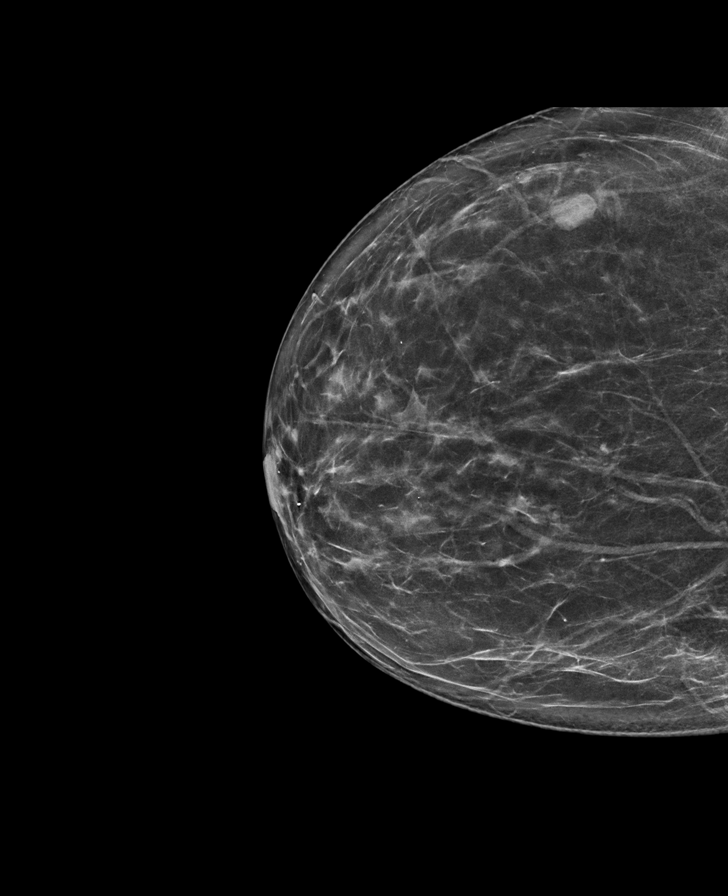

[R CC tomo · tomo slice 37/74.0]
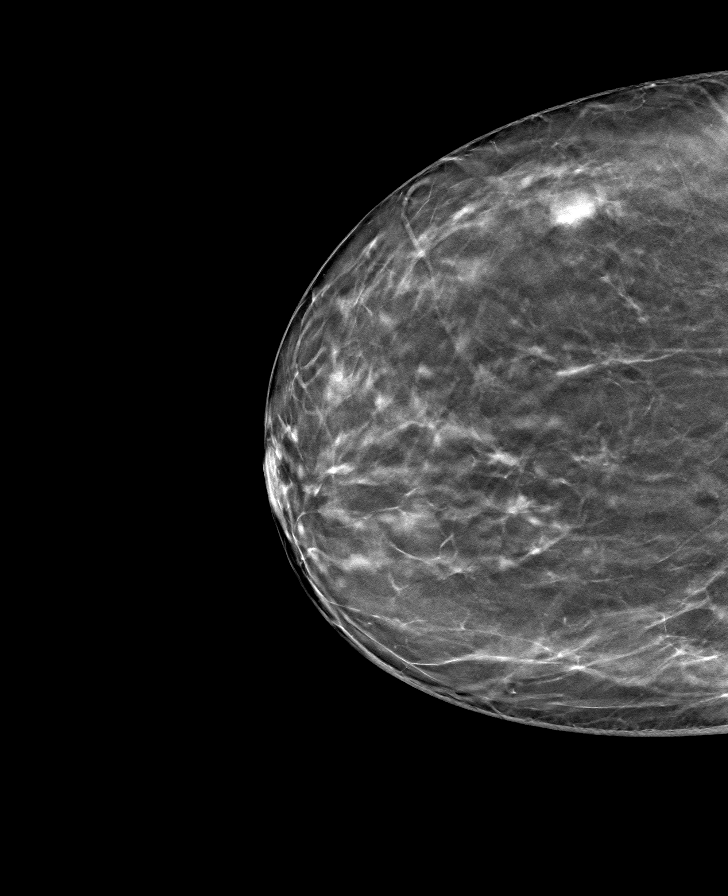

[R MLO tomo · tomo slice 45/90.0]
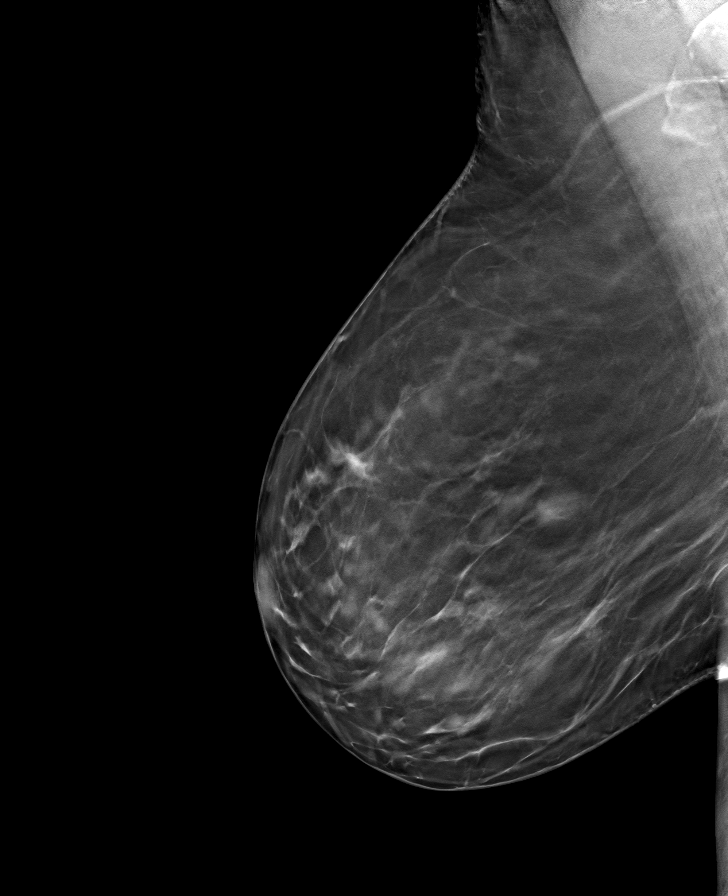

[L MLO tomo · tomo slice 45/89.0]
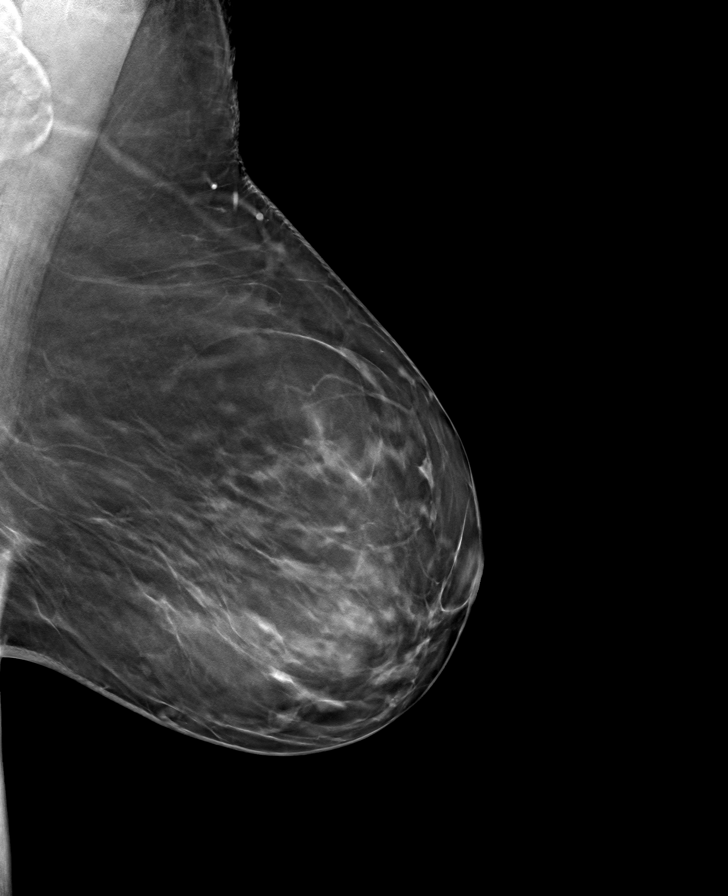

[L CC tomo · tomo slice 39/77.0]
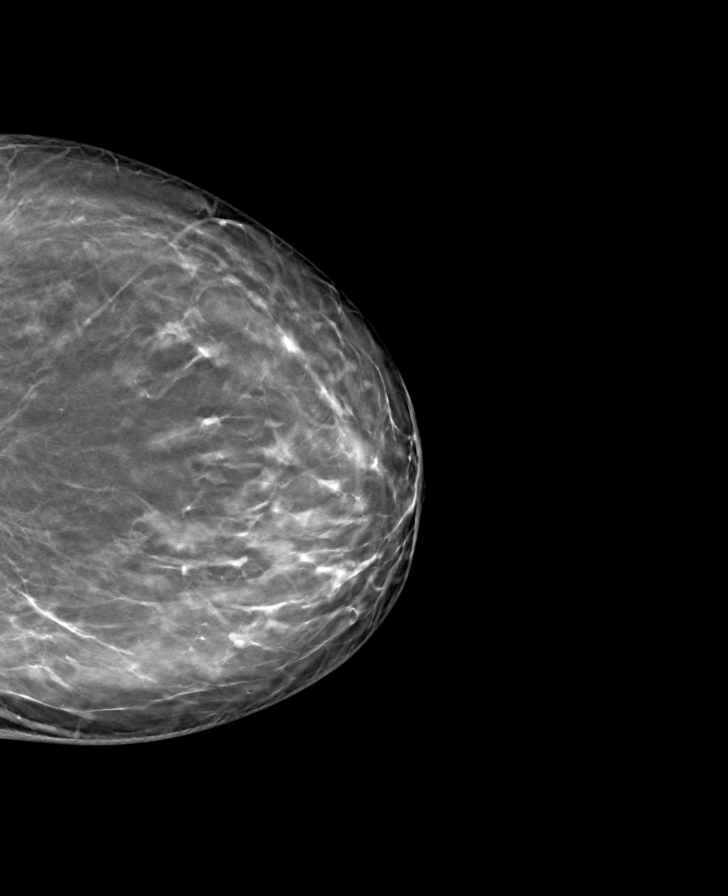

[8 of 24 positions shown; findings below may reference images not displayed]

ACR Breast Density Category b: There are scattered areas of
fibroglandular density.
FINDINGS: In the left breast, possible distortion warrants further evaluation.
In the right breast, no findings suspicious for malignancy. Images
were processed with CAD.
IMPRESSION: Further evaluation is suggested for possible distortion in the left
breast.

RECOMMENDATION:
Diagnostic mammogram and possibly ultrasound of the left breast.
(Code:[1Z])

The patient will be contacted regarding the findings, and additional
imaging will be scheduled.

BI-RADS CATEGORY  0: Incomplete. Need additional imaging evaluation
and/or prior mammograms for comparison.

## 2019-07-13 ENCOUNTER — Other Ambulatory Visit: Payer: Self-pay | Admitting: Physician Assistant

## 2019-07-13 DIAGNOSIS — R928 Other abnormal and inconclusive findings on diagnostic imaging of breast: Secondary | ICD-10-CM

## 2019-07-19 ENCOUNTER — Ambulatory Visit
Admission: RE | Admit: 2019-07-19 | Discharge: 2019-07-19 | Disposition: A | Discharge status: Home / Self Care | Payer: BC Managed Care – PPO | Source: Ambulatory Visit | Attending: Physician Assistant | Admitting: Physician Assistant

## 2019-07-19 DIAGNOSIS — R928 Other abnormal and inconclusive findings on diagnostic imaging of breast: Secondary | ICD-10-CM

## 2019-07-19 IMAGING — MG MM DIGITAL DIAGNOSTIC UNILAT*L* W/ TOMO W/ CAD
4 series · 4 of 12 positions shown · non-contrast
Comparison: [DATE]

CLINICAL DATA: 63-year-old patient recalled from recent screening
mammogram for evaluation of possible left breast distortion
identified only in the CC projection.

EXAM:
DIGITAL DIAGNOSTIC UNILATERAL LEFT MAMMOGRAM WITH CAD AND TOMO

[L CC synth-2D]
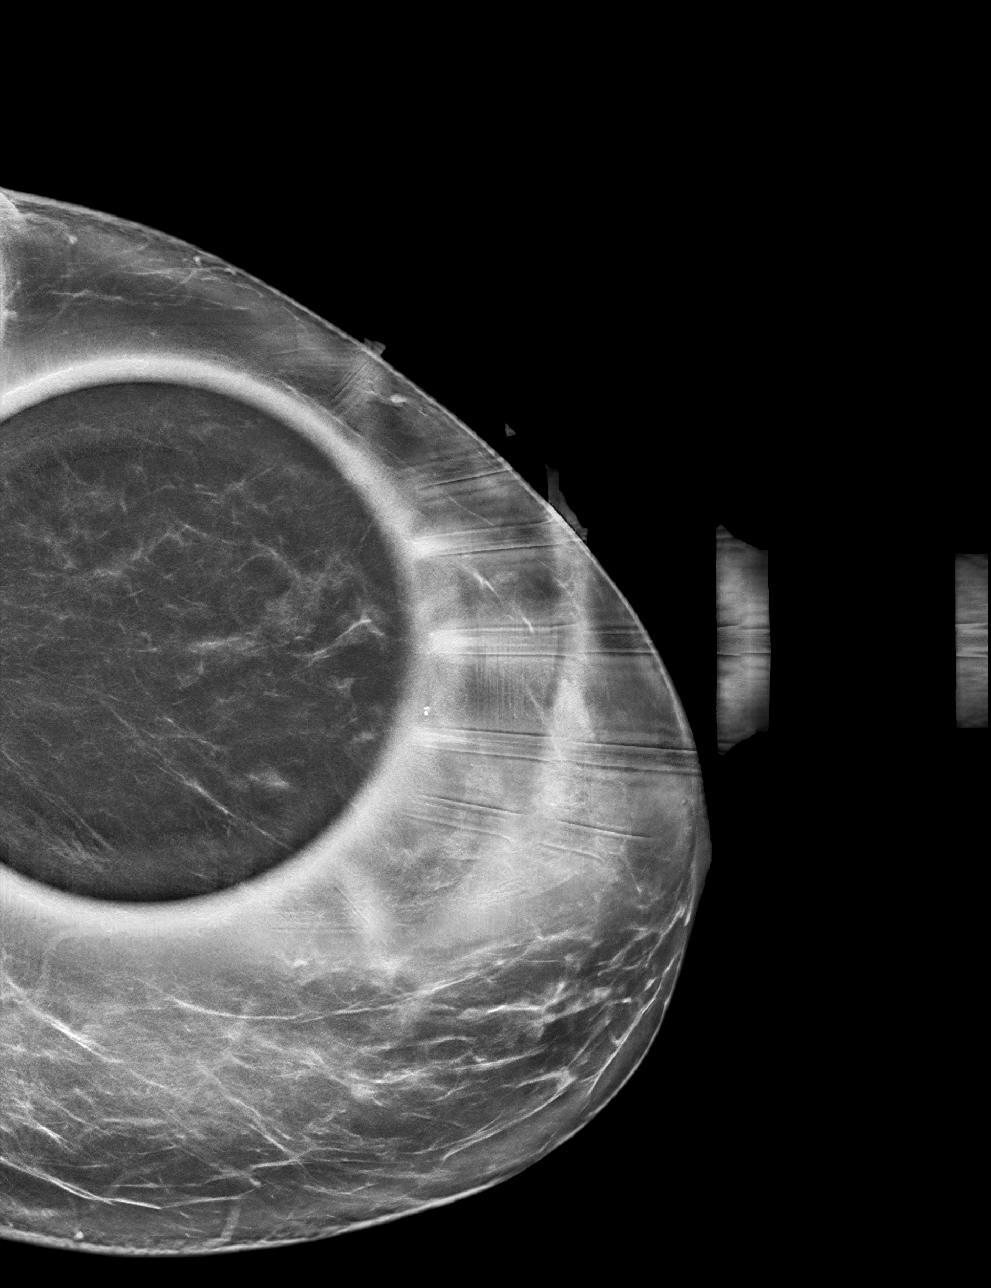

[L ML synth-2D]
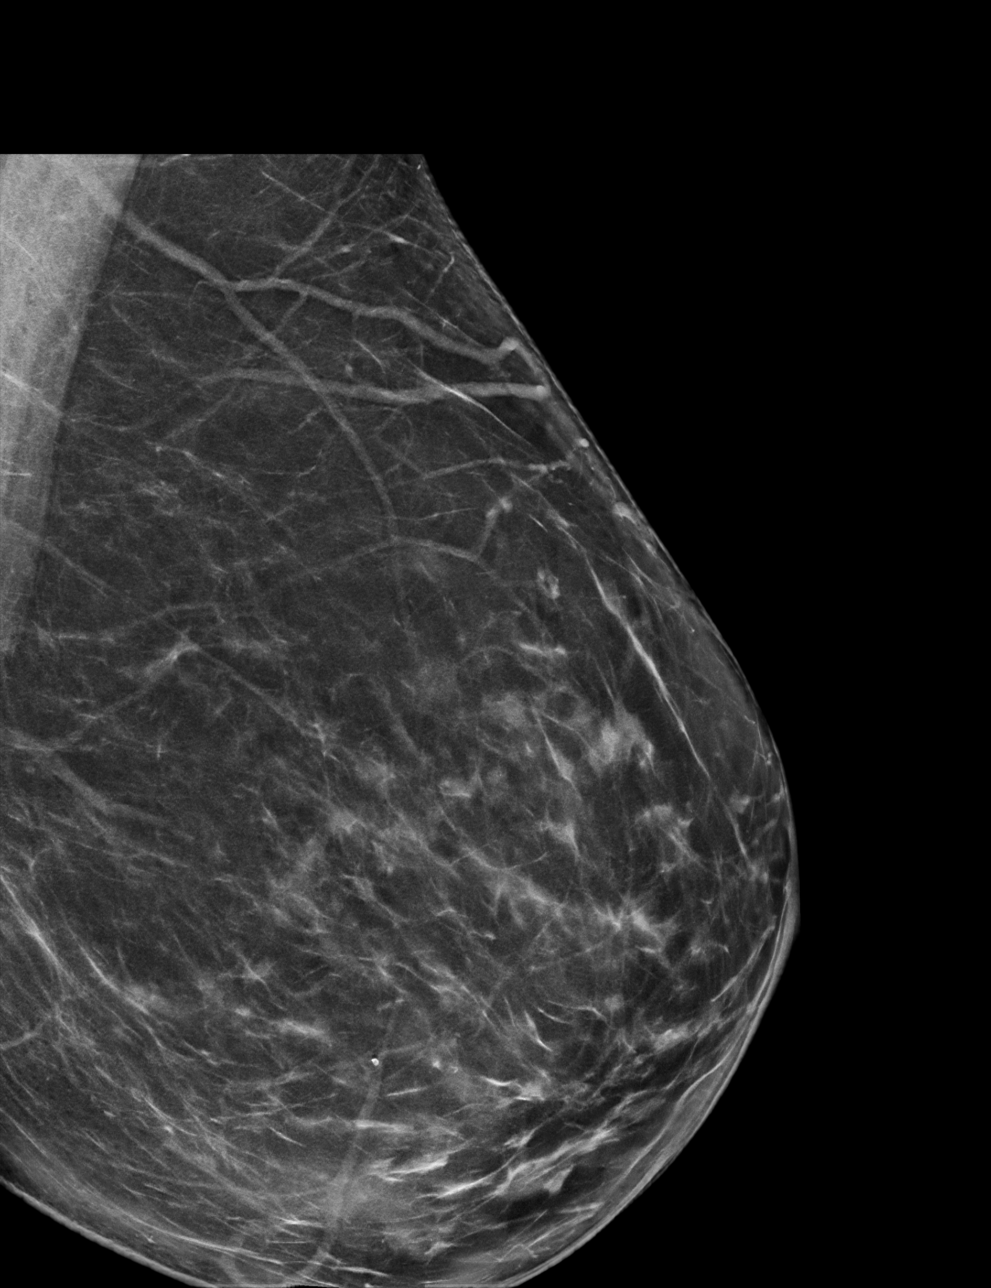

[L CC tomo · tomo slice 39/77.0]
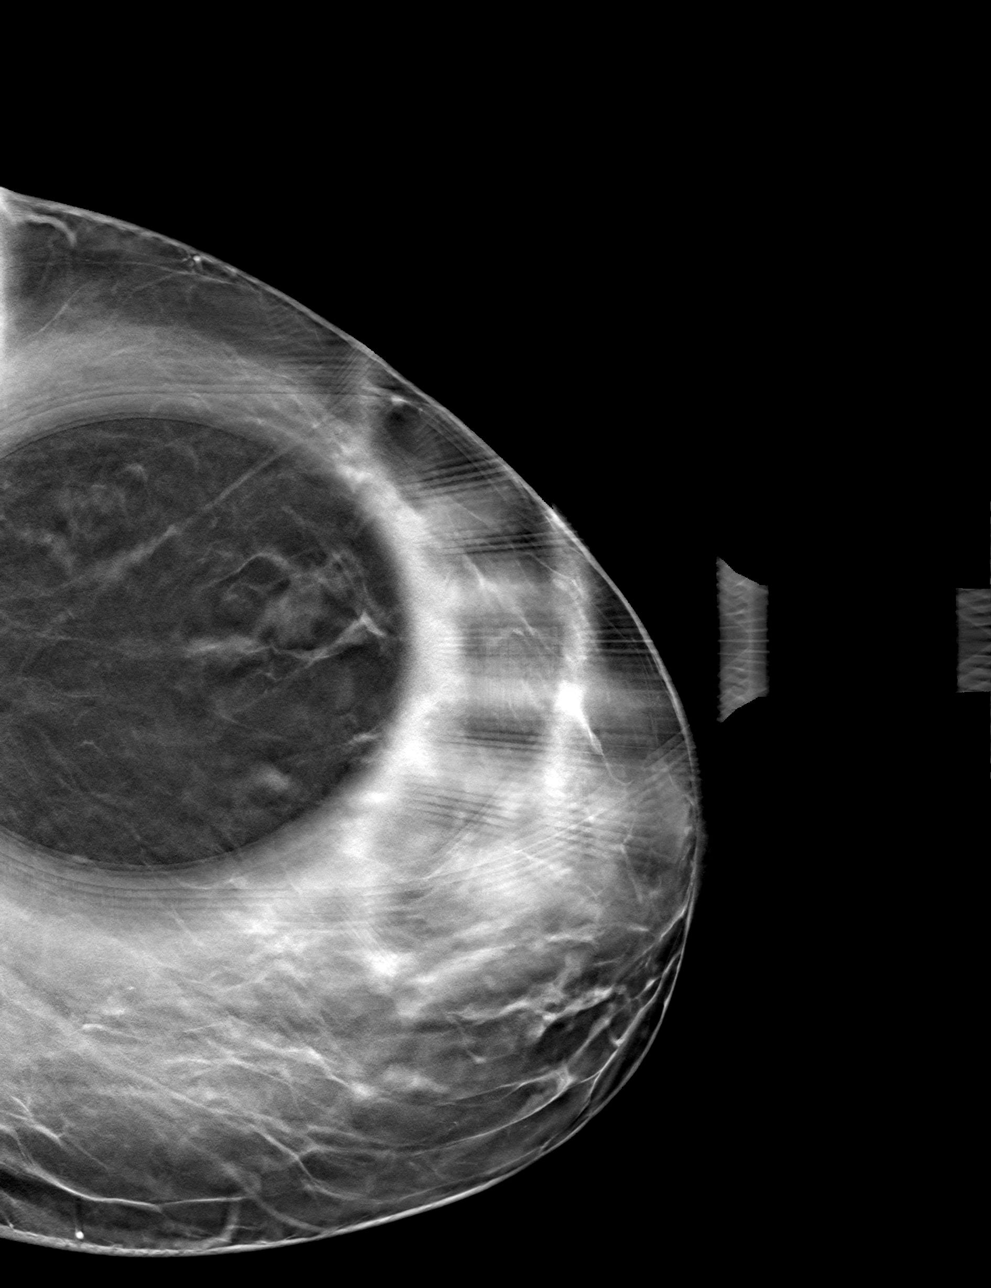

[L ML tomo · tomo slice 37/73.0]
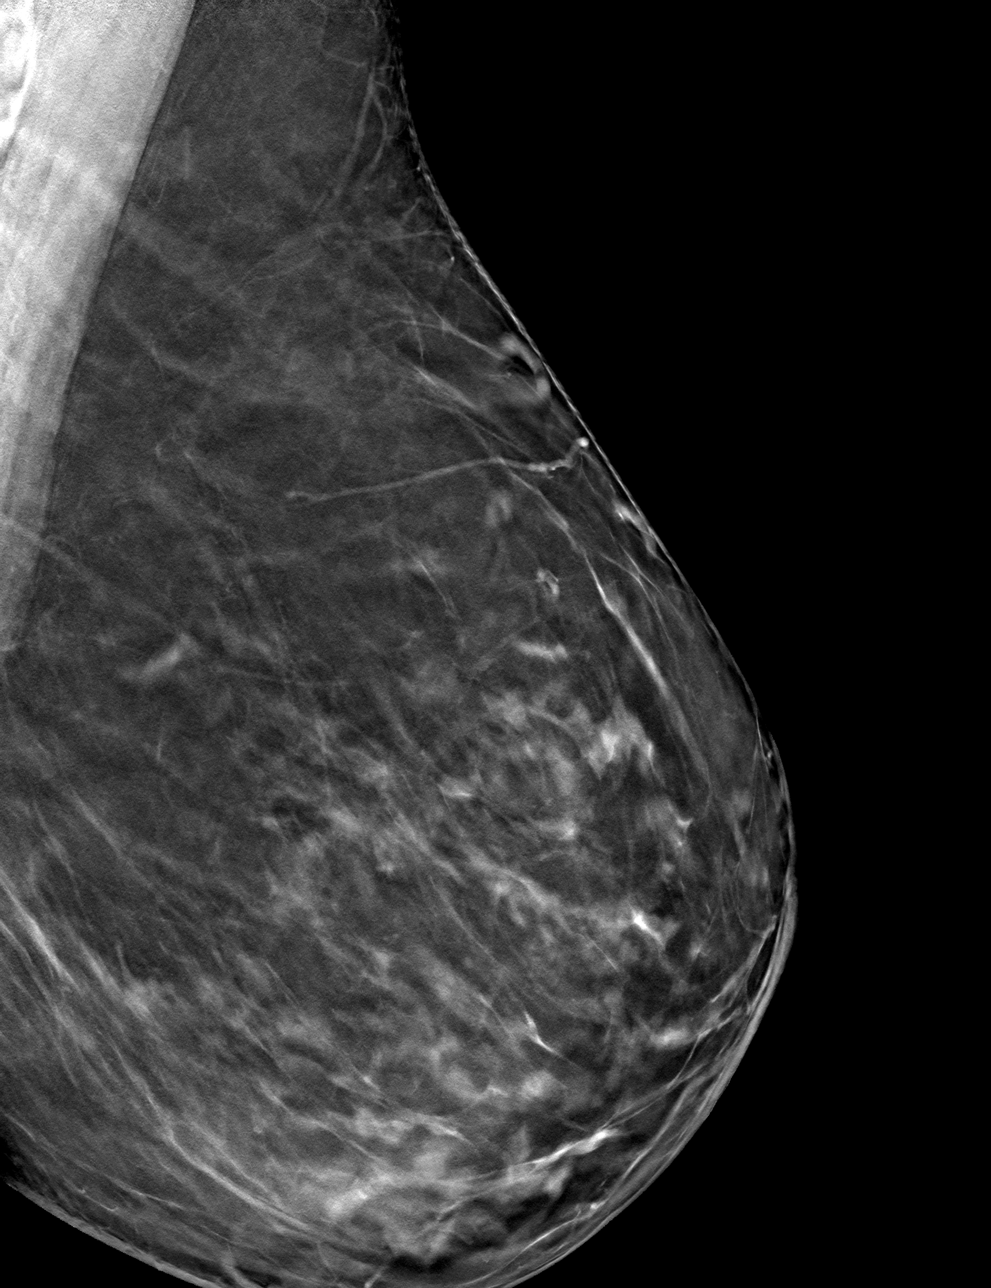

[4 of 12 positions shown; findings below may reference images not displayed]

ACR Breast Density Category b: There are scattered areas of
fibroglandular density.
FINDINGS: Spot compression view of the outer left breast in the CC projection
show a stable island of glandular tissue. There is no architectural
distortion. A 90 degree lateral view of the left breast is negative.

Mammographic images were processed with CAD.
IMPRESSION: No evidence of malignancy in the left breast.

RECOMMENDATION:
Screening mammogram in one year.(Code:[NV])

I have discussed the findings and recommendations with the patient.
If applicable, a reminder letter will be sent to the patient
regarding the next appointment.

BI-RADS CATEGORY  1: Negative.

## 2020-04-19 ENCOUNTER — Other Ambulatory Visit: Payer: Self-pay | Admitting: Physician Assistant

## 2020-04-19 DIAGNOSIS — Z1231 Encounter for screening mammogram for malignant neoplasm of breast: Secondary | ICD-10-CM

## 2020-07-12 ENCOUNTER — Other Ambulatory Visit: Payer: Self-pay

## 2020-07-12 ENCOUNTER — Ambulatory Visit
Admission: RE | Admit: 2020-07-12 | Discharge: 2020-07-12 | Disposition: A | Discharge status: Home / Self Care | Payer: BC Managed Care – PPO | Source: Ambulatory Visit | Attending: Physician Assistant | Admitting: Physician Assistant

## 2020-07-12 DIAGNOSIS — Z1231 Encounter for screening mammogram for malignant neoplasm of breast: Secondary | ICD-10-CM | POA: Diagnosis not present

## 2020-07-12 IMAGING — MG DIGITAL SCREENING BILAT W/ TOMO W/ CAD
8 series · 8 of 24 positions shown · non-contrast
Comparison: Previous exam(s).

CLINICAL DATA: Screening.

EXAM:
DIGITAL SCREENING BILATERAL MAMMOGRAM WITH TOMO AND CAD

[L CC synth-2D]
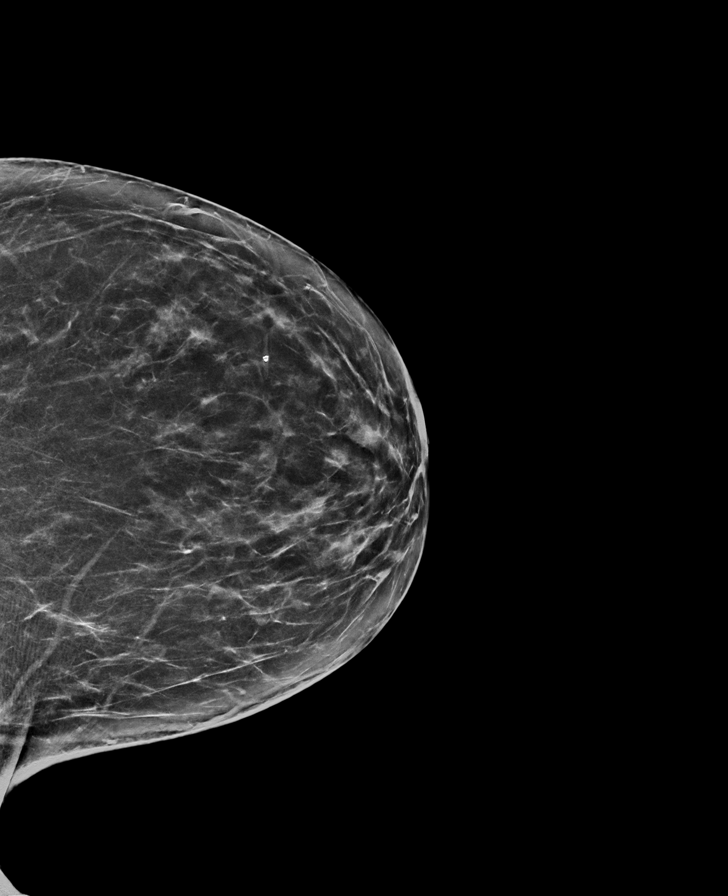

[R MLO synth-2D]
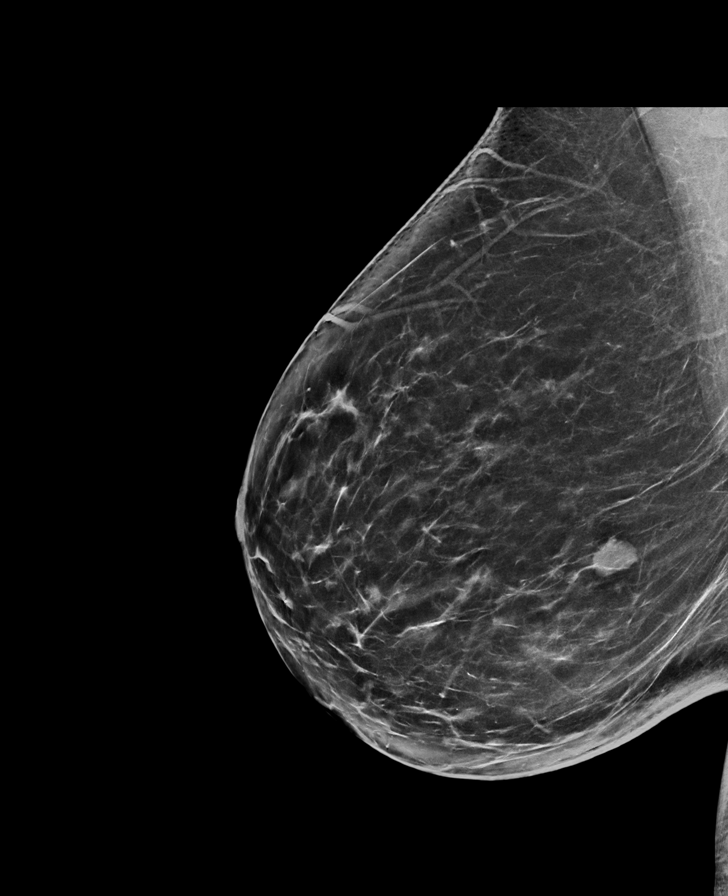

[R CC synth-2D]
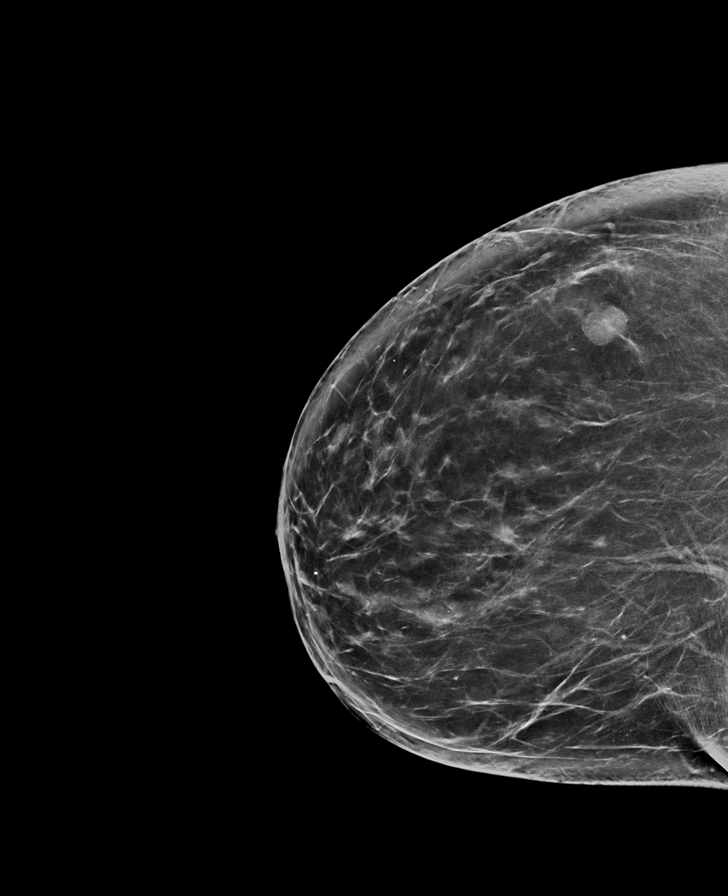

[L MLO synth-2D]
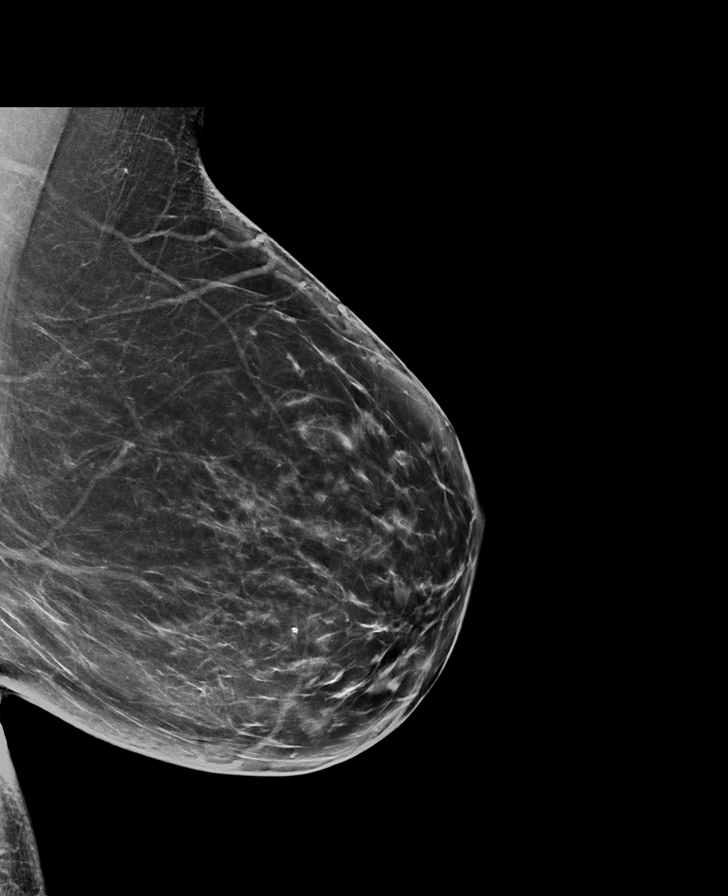

[L MLO tomo · tomo slice 41/81.0]
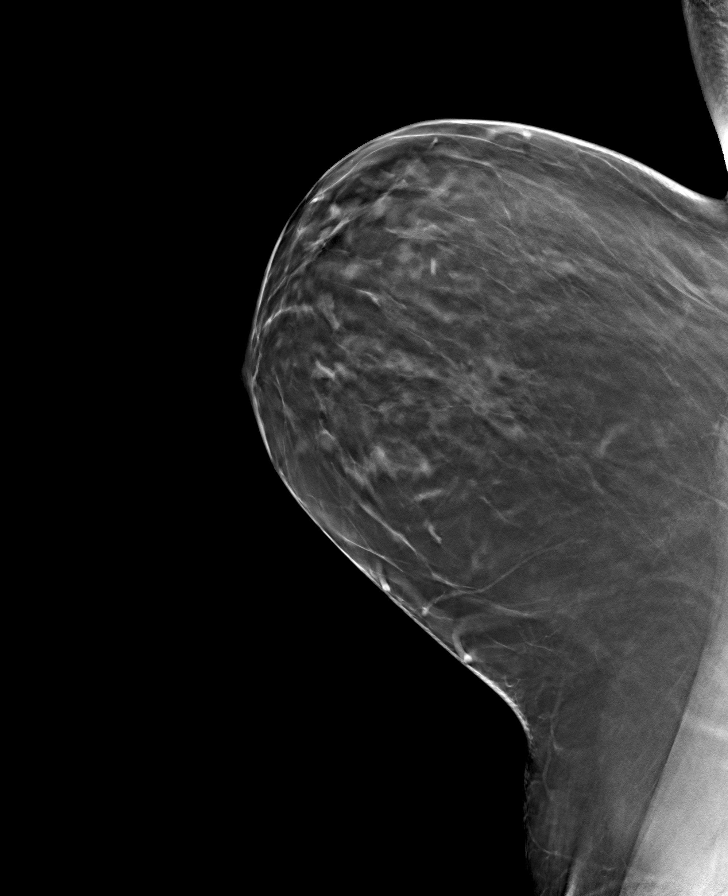

[R CC tomo · tomo slice 41/80.0]
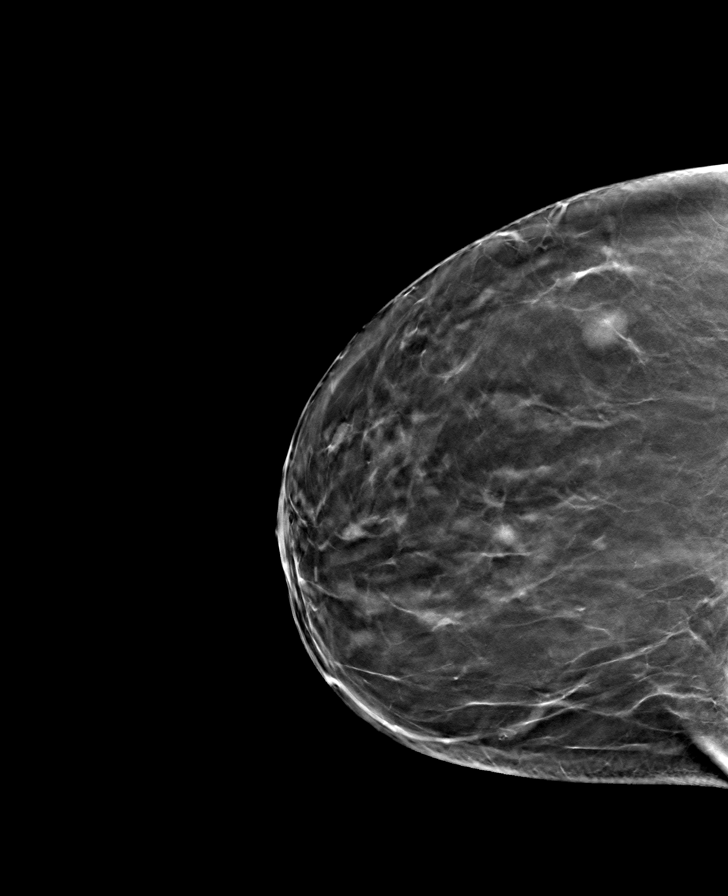

[R MLO tomo · tomo slice 40/79.0]
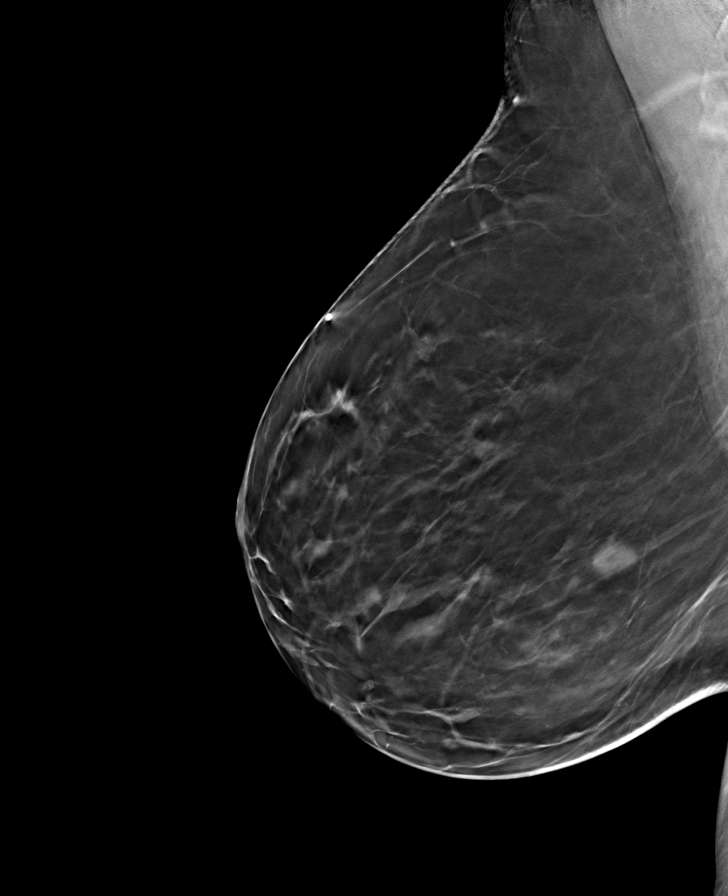

[L CC tomo · tomo slice 37/73.0]
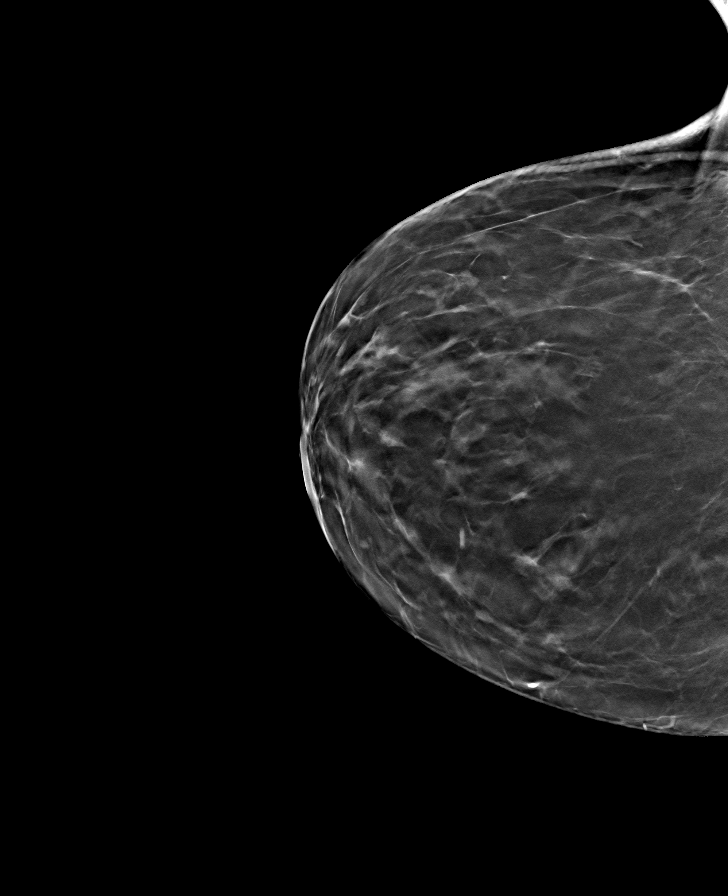

[8 of 24 positions shown; findings below may reference images not displayed]

ACR Breast Density Category b: There are scattered areas of
fibroglandular density.
FINDINGS: There are no findings suspicious for malignancy. Images were
processed with CAD.
IMPRESSION: No mammographic evidence of malignancy. A result letter of this
screening mammogram will be mailed directly to the patient.

RECOMMENDATION:
Screening mammogram in one year. (Code:[TQ])

BI-RADS CATEGORY  1: Negative.

## 2020-12-30 DIAGNOSIS — U071 COVID-19: Secondary | ICD-10-CM

## 2020-12-30 HISTORY — DX: COVID-19: U07.1

## 2021-01-07 ENCOUNTER — Other Ambulatory Visit: Payer: Self-pay | Admitting: Family Medicine

## 2021-01-07 DIAGNOSIS — M5030 Other cervical disc degeneration, unspecified cervical region: Secondary | ICD-10-CM

## 2021-01-07 DIAGNOSIS — M5412 Radiculopathy, cervical region: Secondary | ICD-10-CM

## 2021-02-12 ENCOUNTER — Ambulatory Visit
Admission: RE | Admit: 2021-02-12 | Discharge: 2021-02-12 | Disposition: A | Discharge status: Home / Self Care | Payer: BLUE CROSS/BLUE SHIELD | Source: Ambulatory Visit | Attending: Family Medicine | Admitting: Family Medicine

## 2021-02-12 DIAGNOSIS — M5412 Radiculopathy, cervical region: Secondary | ICD-10-CM

## 2021-02-12 DIAGNOSIS — M5030 Other cervical disc degeneration, unspecified cervical region: Secondary | ICD-10-CM

## 2021-02-12 IMAGING — MR MR CERVICAL SPINE W/O CM
4 of 5 series · 29 of 48 positions shown · non-contrast
Comparison: Report from radiographs of the cervical spine
[DATE].

CLINICAL DATA: Cervical radiculitis. Degenerative disc disease,
cervical. Additional history provided by scanning technologist:
Patient reports chronic neck pain and headaches for 10 years.

EXAM:
MRI CERVICAL SPINE WITHOUT CONTRAST
TECHNIQUE: Multiplanar, multisequence MR imaging of the cervical spine was
performed. No intravenous contrast was administered.

[Series 3: T2 · sagittal · 3.0mm · 0.66mm/px · 8 of 15 slices shown (1 of 2)]
[im 1/15]
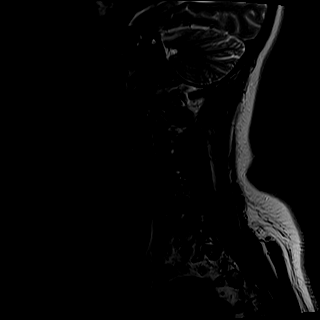
[im 3/15]
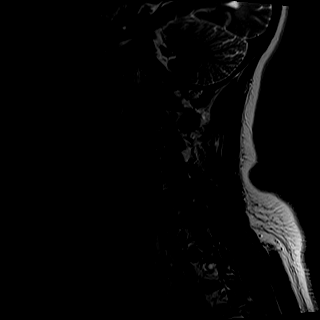
[im 5/15]
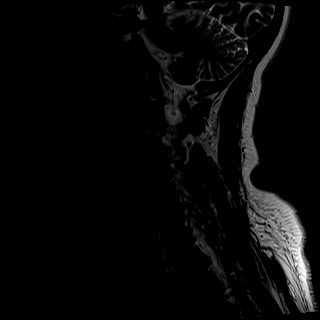
[im 7/15]
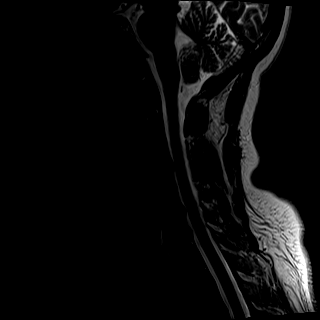
[im 9/15]
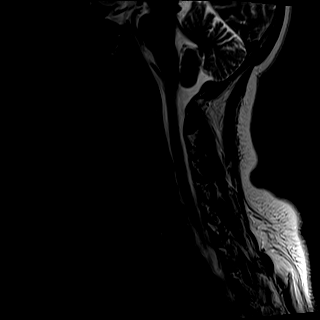
[im 11/15]
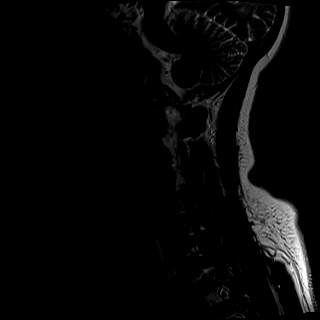
[im 13/15]
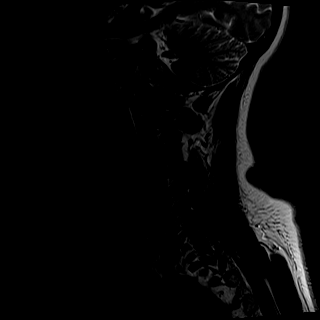
[im 15/15]
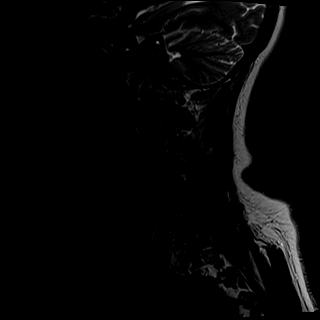

[Series 4: T1 · sagittal · 3.0mm · 0.41mm/px · 7 of 15 slices shown]
[im 1/15]
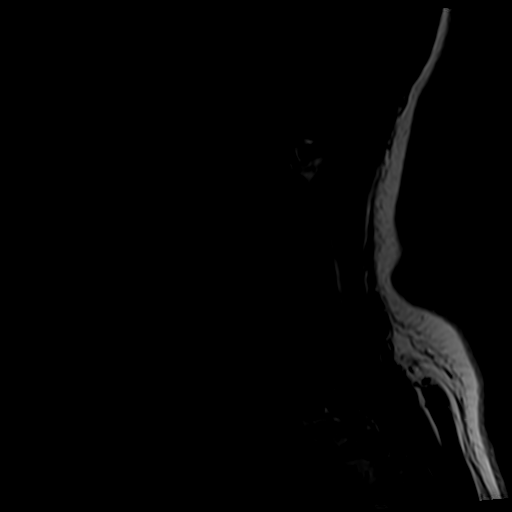
[im 3/15]
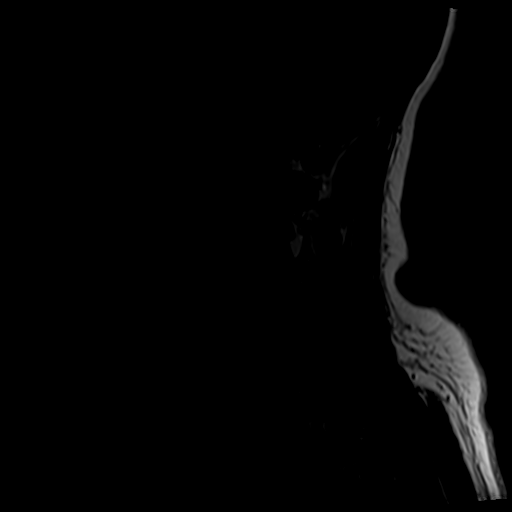
[im 5/15]
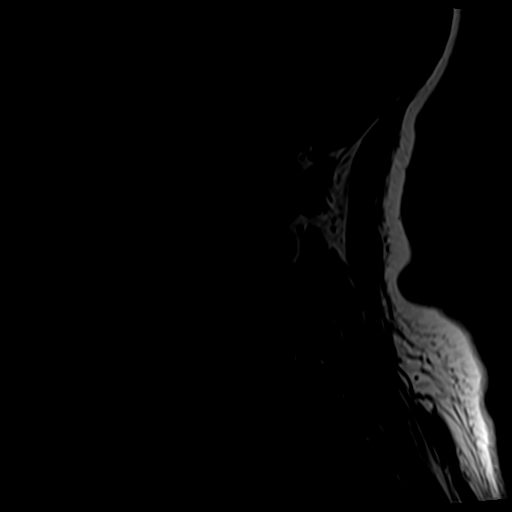
[im 8/15]
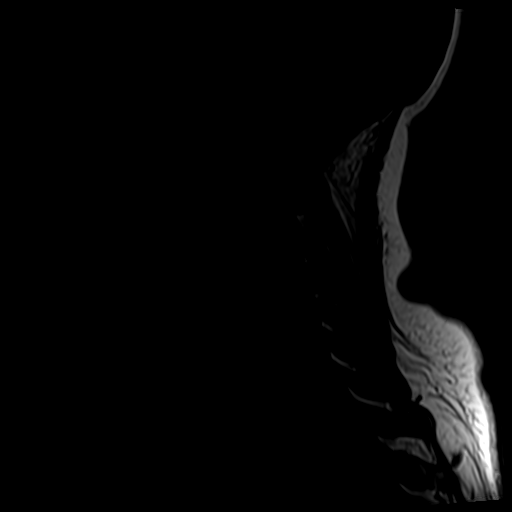
[im 10/15]
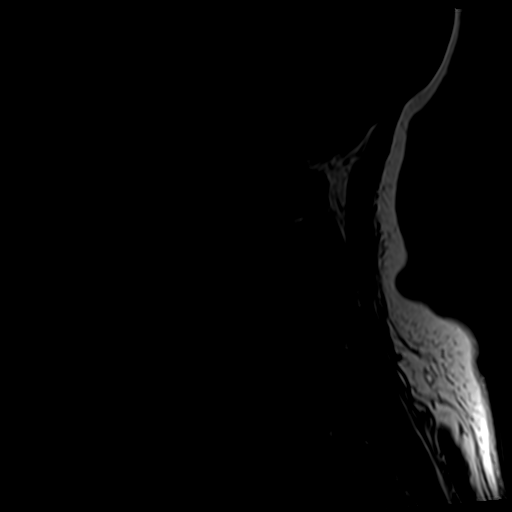
[im 12/15]
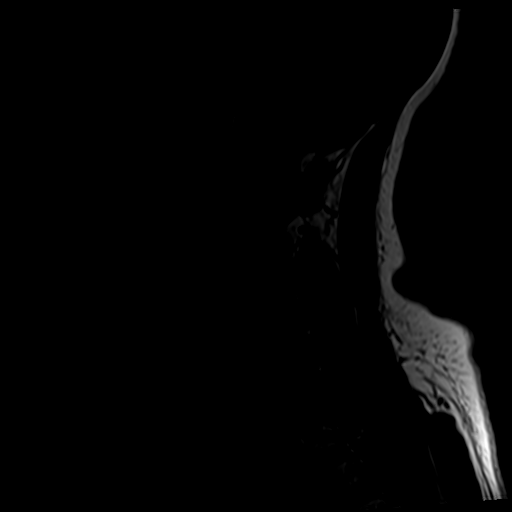
[im 15/15]
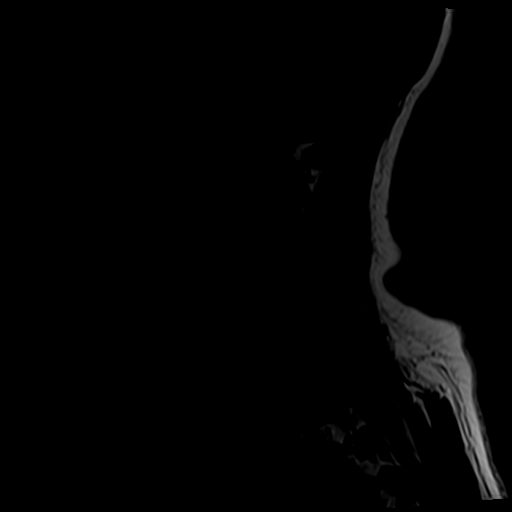

[Series 5: tir sag · sagittal · 3.0mm · 0.41mm/px · 5 of 15 slices shown]
[im 1/15]
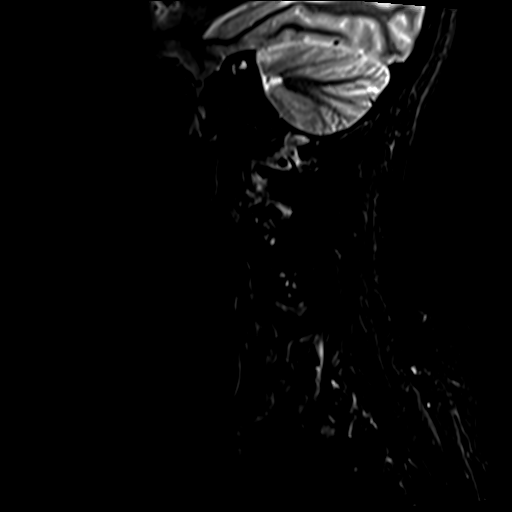
[im 3/15]
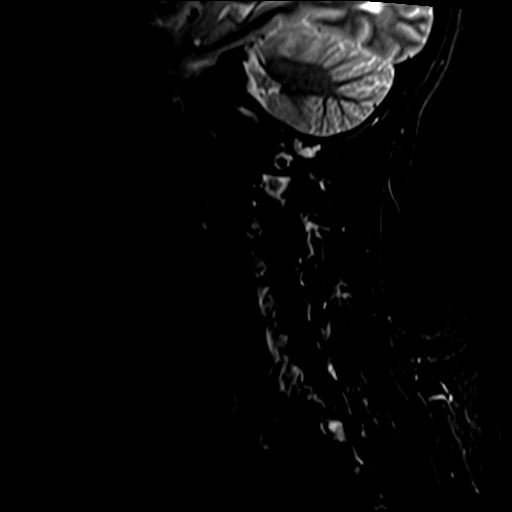
[im 5/15]
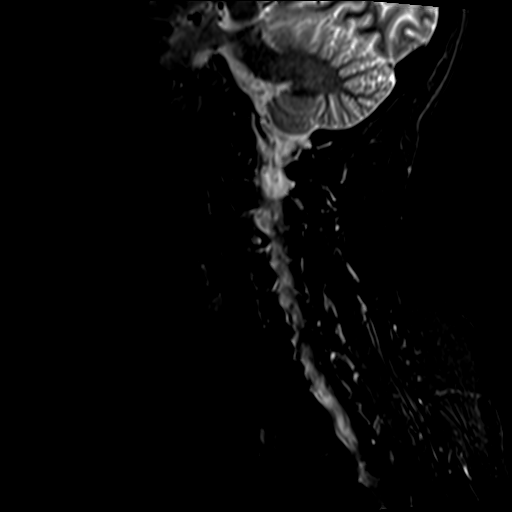
[im 8/15]
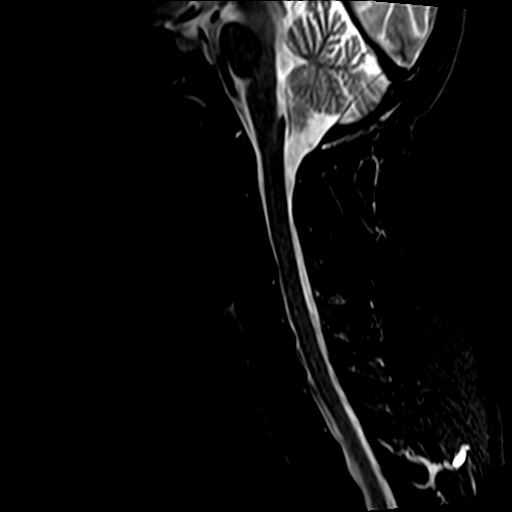
[im 12/15]
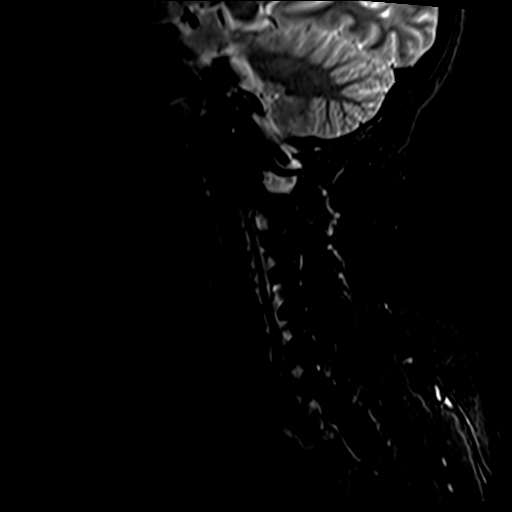

[Series 7: T2 · axial · 3.0mm · 0.70mm/px · z∈[-45,+47]mm · 9 of 27 slices shown (2 of 2)]
[im 1/27]
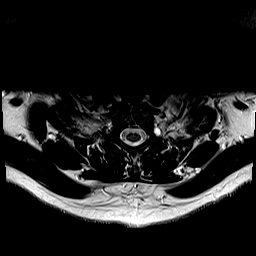
[im 5/27]
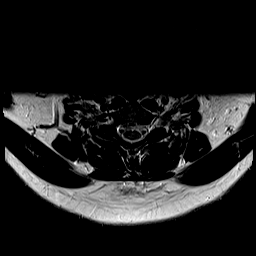
[im 9/27]
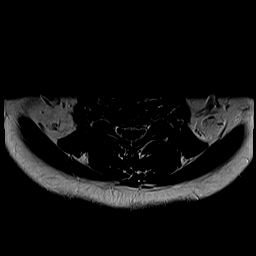
[im 11/27]
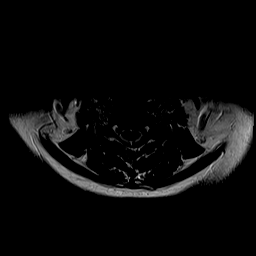
[im 14/27]
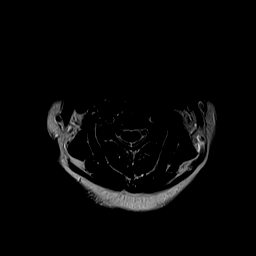
[im 16/27]
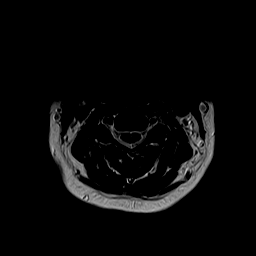
[im 18/27]
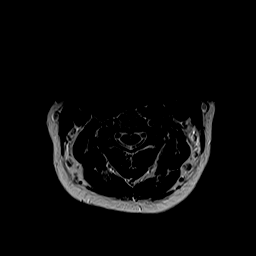
[im 22/27]
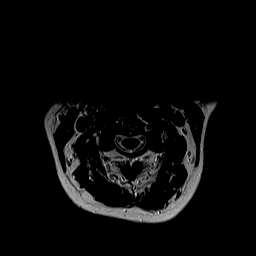
[im 27/27]
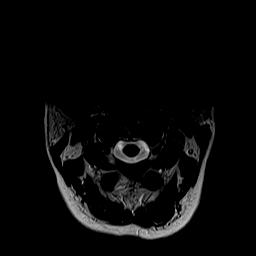

[29 of 48 positions shown; findings below may reference images not displayed]

FINDINGS: Mild intermittent motion degradation.

Alignment: Straightening of the expected cervical lordosis. No
significant spondylolisthesis.

Vertebrae: Vertebral body height is maintained. No significant
marrow edema or focal suspicious osseous lesion.

Cord: No spinal cord signal abnormality is identified.

Posterior Fossa, vertebral arteries, paraspinal tissues: No
abnormality identified within included portions of the posterior
fossa. Flow voids preserved within the imaged cervical vertebral
arteries. Paraspinal soft tissues within normal limits.

Disc levels:

Mild-to-moderate disc degeneration at C5-C6 and C6-C7. No more than
mild disc degeneration at the remaining levels.

C2-C3: Tiny central disc protrusion. No significant spinal canal or
foraminal stenosis.

C3-C4: Mild bilateral uncovertebral hypertrophy. No significant disc
herniation or stenosis.

C4-C5: Minimal facet arthrosis on the left. No significant disc
herniation or stenosis.

C5-C6: Disc bulge with bilateral disc osteophyte ridge/uncinate
hypertrophy. Minimal facet arthrosis (predominantly on the left).
Minimal effacement of the ventral thecal sac without spinal cord
mass effect. Mild/moderate left neural foraminal narrowing.

C6-C7: Disc bulge with superimposed tiny central disc protrusion.
Mild uncovertebral hypertrophy. Minimal facet arthrosis on the left.
Minimal partial effacement of the ventral thecal sac without spinal
cord mass effect. Mild right neural foraminal narrowing.

C7-T1: Minimal facet arthrosis. No significant disc herniation or
stenosis.
IMPRESSION: Mildly motion degraded exam.

Cervical spondylosis, as outlined. Minimal relative spinal canal
narrowing at C5-C6 and C6-C7 without spinal cord mass effect. Neural
foraminal narrowing on the left at C5-C6 (mild/moderate) and on the
right at C6-C7 (mild).

Disc degeneration is greatest at C5-C6 and C6-C7 (mild-to-moderate
at these levels).

Nonspecific straightening of the expected cervical lordosis.

## 2021-02-20 ENCOUNTER — Emergency Department: Payer: BLUE CROSS/BLUE SHIELD

## 2021-02-20 ENCOUNTER — Observation Stay
Admission: EM | Admit: 2021-02-20 | Discharge: 2021-02-22 | Disposition: A | Discharge status: Home / Self Care | Payer: BLUE CROSS/BLUE SHIELD | Attending: Internal Medicine | Admitting: Internal Medicine

## 2021-02-20 ENCOUNTER — Other Ambulatory Visit: Payer: Self-pay

## 2021-02-20 ENCOUNTER — Encounter: Payer: Self-pay | Admitting: Internal Medicine

## 2021-02-20 DIAGNOSIS — Z79899 Other long term (current) drug therapy: Secondary | ICD-10-CM | POA: Insufficient documentation

## 2021-02-20 DIAGNOSIS — F418 Other specified anxiety disorders: Secondary | ICD-10-CM

## 2021-02-20 DIAGNOSIS — R29818 Other symptoms and signs involving the nervous system: Secondary | ICD-10-CM

## 2021-02-20 DIAGNOSIS — Z8616 Personal history of COVID-19: Secondary | ICD-10-CM | POA: Diagnosis not present

## 2021-02-20 DIAGNOSIS — J309 Allergic rhinitis, unspecified: Secondary | ICD-10-CM | POA: Insufficient documentation

## 2021-02-20 DIAGNOSIS — R7303 Prediabetes: Secondary | ICD-10-CM

## 2021-02-20 DIAGNOSIS — Z20822 Contact with and (suspected) exposure to covid-19: Secondary | ICD-10-CM | POA: Insufficient documentation

## 2021-02-20 DIAGNOSIS — M797 Fibromyalgia: Secondary | ICD-10-CM

## 2021-02-20 DIAGNOSIS — D72829 Elevated white blood cell count, unspecified: Secondary | ICD-10-CM | POA: Diagnosis not present

## 2021-02-20 DIAGNOSIS — I10 Essential (primary) hypertension: Secondary | ICD-10-CM | POA: Diagnosis not present

## 2021-02-20 DIAGNOSIS — R29810 Facial weakness: Secondary | ICD-10-CM | POA: Diagnosis not present

## 2021-02-20 DIAGNOSIS — E876 Hypokalemia: Secondary | ICD-10-CM | POA: Diagnosis not present

## 2021-02-20 DIAGNOSIS — J45909 Unspecified asthma, uncomplicated: Secondary | ICD-10-CM | POA: Insufficient documentation

## 2021-02-20 DIAGNOSIS — R1310 Dysphagia, unspecified: Secondary | ICD-10-CM | POA: Diagnosis not present

## 2021-02-20 LAB — DIFFERENTIAL
Abs Immature Granulocytes: 0.05 10*3/uL (ref 0.00–0.07)
Basophils Absolute: 0.1 10*3/uL (ref 0.0–0.1)
Basophils Relative: 1 %
Eosinophils Absolute: 0 10*3/uL (ref 0.0–0.5)
Eosinophils Relative: 0 %
Immature Granulocytes: 0 %
Lymphocytes Relative: 31 %
Lymphs Abs: 3.9 10*3/uL (ref 0.7–4.0)
Monocytes Absolute: 1 10*3/uL (ref 0.1–1.0)
Monocytes Relative: 8 %
Neutro Abs: 7.5 10*3/uL (ref 1.7–7.7)
Neutrophils Relative %: 60 %

## 2021-02-20 LAB — CBC
HCT: 42.6 % (ref 36.0–46.0)
Hemoglobin: 14.5 g/dL (ref 12.0–15.0)
MCH: 29.7 pg (ref 26.0–34.0)
MCHC: 34 g/dL (ref 30.0–36.0)
MCV: 87.3 fL (ref 80.0–100.0)
Platelets: 272 10*3/uL (ref 150–400)
RBC: 4.88 MIL/uL (ref 3.87–5.11)
RDW: 12.9 % (ref 11.5–15.5)
WBC: 12.5 10*3/uL — ABNORMAL HIGH (ref 4.0–10.5)
nRBC: 0 % (ref 0.0–0.2)

## 2021-02-20 LAB — PROTIME-INR
INR: 0.9 (ref 0.8–1.2)
Prothrombin Time: 11.7 seconds (ref 11.4–15.2)

## 2021-02-20 LAB — COMPREHENSIVE METABOLIC PANEL
ALT: 20 U/L (ref 0–44)
AST: 22 U/L (ref 15–41)
Albumin: 4.1 g/dL (ref 3.5–5.0)
Alkaline Phosphatase: 60 U/L (ref 38–126)
Anion gap: 8 (ref 5–15)
BUN: 18 mg/dL (ref 8–23)
CO2: 28 mmol/L (ref 22–32)
Calcium: 9.4 mg/dL (ref 8.9–10.3)
Chloride: 104 mmol/L (ref 98–111)
Creatinine, Ser: 0.83 mg/dL (ref 0.44–1.00)
GFR, Estimated: >60 mL/min (ref >60)
Glucose, Bld: 123 mg/dL — ABNORMAL HIGH (ref 70–99)
Potassium: 3.2 mmol/L — ABNORMAL LOW (ref 3.5–5.1)
Sodium: 140 mmol/L (ref 135–145)
Total Bilirubin: 0.7 mg/dL (ref 0.3–1.2)
Total Protein: 7.2 g/dL (ref 6.5–8.1)

## 2021-02-20 LAB — CBG MONITORING, ED: Glucose-Capillary: 138 mg/dL — ABNORMAL HIGH (ref 70–99)

## 2021-02-20 LAB — APTT: aPTT: 26 seconds (ref 24–36)

## 2021-02-20 LAB — RESP PANEL BY RT-PCR (FLU A&B, COVID) ARPGX2
Influenza A by PCR: NEGATIVE
Influenza B by PCR: NEGATIVE
SARS Coronavirus 2 by RT PCR: NEGATIVE

## 2021-02-20 MED ORDER — ACETAMINOPHEN 650 MG RE SUPP: 650.0000 mg | RECTAL | Status: DC | PRN

## 2021-02-20 MED ORDER — ACETAMINOPHEN 160 MG/5ML PO SOLN
650.0000 mg | ORAL | Status: DC | PRN
  Filled 2021-02-20: qty 20.3

## 2021-02-20 MED ORDER — STROKE: EARLY STAGES OF RECOVERY BOOK: Freq: Once | Status: AC

## 2021-02-20 MED ORDER — SODIUM CHLORIDE 0.9 % IV SOLN: INTRAVENOUS | Status: DC

## 2021-02-20 MED ORDER — ASPIRIN 81 MG PO CHEW
324.0000 mg | CHEWABLE_TABLET | Freq: Once | ORAL | Status: AC
  Administered 2021-02-20: 324 mg via ORAL
  Filled 2021-02-20: qty 4

## 2021-02-20 MED ORDER — POTASSIUM CHLORIDE CRYS ER 20 MEQ PO TBCR
40.0000 meq | EXTENDED_RELEASE_TABLET | Freq: Once | ORAL | Status: AC
  Administered 2021-02-20: 40 meq via ORAL
  Filled 2021-02-20: qty 2

## 2021-02-20 MED ORDER — ENOXAPARIN SODIUM 40 MG/0.4ML IJ SOSY
40.0000 mg | PREFILLED_SYRINGE | INTRAMUSCULAR | Status: DC
  Administered 2021-02-21: 40 mg via SUBCUTANEOUS
  Filled 2021-02-20: qty 0.4

## 2021-02-20 MED ORDER — HYDROCODONE-ACETAMINOPHEN 5-325 MG PO TABS
1.0000 | ORAL_TABLET | Freq: Four times a day (QID) | ORAL | Status: DC | PRN
  Administered 2021-02-21 (×2): 1 via ORAL
  Filled 2021-02-20 (×2): qty 1

## 2021-02-20 MED ORDER — ACETAMINOPHEN 325 MG PO TABS
650.0000 mg | ORAL_TABLET | ORAL | Status: DC | PRN
  Administered 2021-02-21 (×2): 650 mg via ORAL
  Filled 2021-02-20 (×2): qty 2

## 2021-02-20 MED ORDER — PREDNISONE 20 MG PO TABS
20.0000 mg | ORAL_TABLET | Freq: Every day | ORAL | Status: DC
  Administered 2021-02-21 – 2021-02-22 (×2): 20 mg via ORAL
  Filled 2021-02-20 (×2): qty 1

## 2021-02-20 MED ORDER — SENNOSIDES-DOCUSATE SODIUM 8.6-50 MG PO TABS: 1.0000 | ORAL_TABLET | Freq: Every evening | ORAL | Status: DC | PRN

## 2021-02-20 MED ORDER — CITALOPRAM HYDROBROMIDE 20 MG PO TABS
20.0000 mg | ORAL_TABLET | Freq: Every day | ORAL | Status: DC
  Administered 2021-02-21 – 2021-02-22 (×2): 20 mg via ORAL
  Filled 2021-02-20 (×2): qty 1

## 2021-02-20 NOTE — Progress Notes (Signed)
   02/20/21 1852  Clinical Encounter Type  Visited With Patient and family together  Visit Type Initial;Spiritual support;Social support  Referral From Nurse  Consult/Referral To Chaplain   Posey Boyer responded to Code Stroke page. The husband is present with PT. Chaplain ministered with presence.  PT was taken for CT scan. Chaplain told Diplomatic Services operational officer to contact chaplain if needed.

## 2021-02-20 NOTE — ED Notes (Signed)
Pt visualized by Dr Fanny Bien ok for CT code stroke

## 2021-02-20 NOTE — ED Notes (Signed)
Pt from [facility] with blurred vision left eye and left facial droop, states drooling while eating dinner. Sx started 1800

## 2021-02-20 NOTE — ED Notes (Signed)
Dr. Quale at bedside.  

## 2021-02-20 NOTE — Consult Note (Signed)
NEUROLOGY CONSULTATION NOTE   Date of service: February 20, 2021 Patient Name: April Lindsey MRN:  [id number] DOB:  1955/12/26 Reason for consult: acute onset L facial droop Consult requested by: Dr. Fanny Bien ED _ _ _   _ __   _ __ _ _  __ __   _ __   __ _  History of Present Illness   April Lindsey is a 65 y.o. female with PMH significant for  has a past medical history of Anxiety, Arthritis, Fibromyalgia, and Herniated lumbar intervertebral disc. who presents with acute onset isolated L facial droop LKW 1700 today. She has had intermittent numbness of LUE over past week but that resolved. She was eating dinner tonight when she noticed food dribbling out of her mouth and L facial droop. NIHSS = 2 for pronounced facial palsy with no other focal neuro deficits. tPA not administered 2/2 low NIHSS. CTH NAICP. Bells palsy considered but forehead elevates symmetrically. CTA not performed 2/2 exam not c/w LVO. Patient admitted for stroke/TIA workup.    ROS   Per HPI, all else reviewed and were negative  Past History   Past Medical History:  Diagnosis Date   Anxiety    Arthritis    Fibromyalgia    Herniated lumbar intervertebral disc    Past Surgical History:  Procedure Laterality Date   ABDOMINAL HYSTERECTOMY     BILATERAL CARPAL TUNNEL RELEASE     BREAST BIOPSY Right 1997   benign - Dr Yolanda Bonine   RECTOVAGINAL FISTULA CLOSURE     SEPTOPLASTY     Family History  Problem Relation Age of Onset   Breast cancer Paternal Grandmother 41   Breast cancer Other    Social History   Socioeconomic History   Marital status: Married    Spouse name: Not on file   Number of children: Not on file   Years of education: Not on file   Highest education level: Not on file  Occupational History   Not on file  Tobacco Use   Smoking status: Never   Smokeless tobacco: Not on file  Substance and Sexual Activity   Alcohol use: No   Drug use: Not on file   Sexual activity: Not on file  Other  Topics Concern   Not on file  Social History Narrative   Not on file   Social Determinants of Health   Financial Resource Strain: Not on file  Food Insecurity: Not on file  Transportation Needs: Not on file  Physical Activity: Not on file  Stress: Not on file  Social Connections: Not on file   Allergies  Allergen Reactions   Codeine Nausea And Vomiting   Morphine And Related Itching   Penicillins Itching   Erythromycin Itching and Rash   Tetracyclines & Related Itching and Rash    Medications   (Not in a hospital admission)    Vitals   Vitals:   02/20/21 1850 02/20/21 1901  BP: (!) 183/115   Pulse: (!) 126   Resp: 20   Temp: 98.3 F (36.8 C)   TempSrc: Oral   SpO2: 96%   Weight:  73.3 kg     Body mass index is 28.64 kg/m.  Physical Exam   Physical Exam Gen: A&O x4, NAD HEENT: Atraumatic, normocephalic;mucous membranes moist; oropharynx clear, tongue without atrophy or fasciculations. Neck: Supple, trachea midline. Resp: CTAB, no w/r/r CV: RRR, no m/g/r; nml S1 and S2. 2+ symmetric peripheral pulses. Abd: soft/NT/ND; nabs x 4 quad Extrem:  Nml bulk; no cyanosis, clubbing, or edema.  Neuro: *MS: A&O x4. Follows multi-step commands.  *Speech: fluid, nondysarthric, able to name and repeat *CN:    I: Deferred   II,III: PERRLA, VFF by confrontation, optic discs sharp   III,IV,VI: EOMI w/o nystagmus, no ptosis   V: Sensation intact from V1 to V3 to LT   VII: Eyelid closure was full.  Marked L UMN facial droop; forehead elevates symmetrically   VIII: Hearing intact to voice   IX,X: Voice normal, palate elevates symmetrically    XI: SCM/trap 5/5 bilat   XII: Tongue protrudes midline, no atrophy or fasciculations   *Motor:   Normal bulk.  No tremor, rigidity or bradykinesia. No pronator drift.    Strength: Dlt Bic Tri WrE WrF FgS Gr HF KnF KnE PlF DoF    Left 5 5 5 5 5 5 5 5 5 5 5 5     Right 5 5 5 5 5 5 5 5 5 5 5 5     *Sensory: Intact to light touch,  pinprick, temperature vibration throughout. Symmetric. Propioception intact bilat.  No double-simultaneous extinction.  *Coordination:  Finger-to-nose, heel-to-shin, rapid alternating motions were intact. *Reflexes:  2+ and symmetric throughout without clonus; toes down-going bilat *Gait: normal base, normal stride, normal turn. Negative Romberg.  NIHSS = 2 marked facial palsy   Premorbid mRS = 1   Labs   CBC:  Recent Labs  Lab 02/20/21 1903  WBC 12.5*  NEUTROABS 7.5  HGB 14.5  HCT 42.6  MCV 87.3  PLT 272    Basic Metabolic Panel:  Lab Results  Component Value Date   NA 140 02/20/2021   K 3.2 (L) 02/20/2021   CO2 28 02/20/2021   GLUCOSE 123 (H) 02/20/2021   BUN 18 02/20/2021   CREATININE 0.83 02/20/2021   CALCIUM 9.4 02/20/2021   GFRNONAA >60 02/20/2021   GFRAA >60 08/26/2015   Lipid Panel: No results found for: LDLCALC HgbA1c: No results found for: HGBA1C Urine Drug Screen:     Component Value Date/Time   LABOPIA NONE DETECTED 08/26/2015 1023   COCAINSCRNUR NONE DETECTED 08/26/2015 1023   LABBENZ NONE DETECTED 08/26/2015 1023   AMPHETMU NONE DETECTED 08/26/2015 1023   THCU NONE DETECTED 08/26/2015 1023   LABBARB NONE DETECTED 08/26/2015 1023    Alcohol Level     Component Value Date/Time   ETH <5 08/26/2015 1023     Impression   65 yo woman presents to ED with acute onset L facial droop without other focal neuro deficits. Given the fluctuating LUE numbness over the past week (now resolved) and symmetric forehead elevation, favor small stroke vs TIA.  Recommendations   - Admit to hospitalist service for stroke w/u - Permissive HTN x48 hrs from sx onset or until stroke ruled out by MRI goal BP <220/110. PRN labetalol or hydralazine if BP above these parameters. Avoid oral antihypertensives. - MRI brain wo contrast - MRA H&N - TTE  - Check A1c and LDL + add statin per guidelines - NPO until SLP swallow study - ASA 325mg  now + 81mg  daily after that  (PR until passes formal SLP eval) - q4 hr neuro checks - STAT head CT for any change in neuro exam - Tele - Stroke education - Amb referral to neurology upon discharge   Will continue to follow.   ______________________________________________________________________   Thank you for the opportunity to take part in the care of this patient. If you have any further questions, please contact  the neurology consultation attending.  Signed,  Bing Neighbors, MD [facility] [phone]  If 7pm- 7am, please page neurology on call as listed in AMION.

## 2021-02-20 NOTE — ED Notes (Signed)
CODE stroke called to secretary to be activated.

## 2021-02-20 NOTE — ED Notes (Signed)
Husband at bedside. Neurologist talking to husband.

## 2021-02-20 NOTE — ED Notes (Signed)
CBG 138 Pt to CT

## 2021-02-20 NOTE — ED Notes (Addendum)
Blurry vision, left sided facial droop starting around 6pm while pt was eating. Neurologist at bedside.

## 2021-02-20 NOTE — H&P (Signed)
History and Physical        Hospital Admission Note Date: 02/20/2021  Patient name: April Lindsey Medical record number: [id number] Date of birth: [birth year] Age: 65 y.o. Gender: female  PCP: Patrice Paradise, MD    Patient coming from: Home   I have reviewed all records in the [facility].    Chief Complaint:  Acute Onset Left Facial Droop   HPI: April Lindsey is 65 y.o. female with PMH of fibromyalgia, anxiety/depression, and COVID within the past month who presents for acute onset left facial droop at 1700 today. She has had intermittent numbness of LUE over past week but that resolved. She was eating dinner tonight when she noticed food dribbling out of her mouth and L facial droop. Facial droop has persisted. However, does note initially couldn't close left eyelid and now she can. No weakness of extremities. No difficulty with gait. Has no prior h/o TIA/CVA.   She reports that she had COVID in May. Continued to have respiratory symptoms and was prescribed Prednisone 40 mg daily x5 days. When she stopped this medication she began to have severe fibromyalgia symptoms. She was then restarted on prednisone taper, should lower to 20 mg tomorrow. She was also prescribed Hydrocodone for pain control.    ED work-up/course:    Patient presents with sudden onset left-sided facial weakness for about 1 hour.  This is associated with a feeling of intermittent tingling in the left arm for the last couple days with resolution of that.  On exam she has sparing of the left temporalis which is concerning for central cause though her presentation seems to suggest also a high potential for Bell's palsy.  There is no obvious rash there is no obvious etiology such as HSV, zoster etc. at this point.  Patient was seen and activated as a code stroke due to concerns for an acute central neurologic etiology.  Seen by neurology Dr.  Selina Cooley in the ER, she recommends at this point after review of imaging administration of salicylate, and admission to the hospital for further work-up for neurologic deficit etiology is not yet clear, neurology involved, but differential certainly includes possible stroke TIA, Bell's palsy etc.  As far as the patient's recent COVID, she reports resolution of those symptoms she is afebrile no cough.  I suspect she is cleared from her COVID infection at this point but also is on high-dose prednisone now for fibromyalgia flareup.  ----------------------------------------- 8:23 PM on 02/20/2021 ----------------------------------------- Labs this point revealed mild leukocytosis which is likely in this case related to steroid use.  She denies infectious symptoms or fever.  COVID panel pending and but anticipate her COVID test may return positive though I think she is beyond her infectious window.  Mild hypokalemia  Admission discussed with hospitalist  Review of Systems: Positives marked in 'bold' Constitutional: Denies fever, chills, diaphoresis, poor appetite and fatigue.  HEENT: Denies photophobia, eye pain, redness, hearing loss, ear pain, congestion, sore throat, rhinorrhea, sneezing, mouth sores, trouble swallowing, neck pain, neck stiffness and tinnitus.   Respiratory: Denies SOB, DOE, cough, chest tightness,  and wheezing.   Cardiovascular: Denies chest pain, palpitations and leg  swelling.  Gastrointestinal: Denies nausea, vomiting, abdominal pain, diarrhea, constipation, blood in stool and abdominal distention.  Genitourinary: Denies dysuria, urgency, frequency, hematuria, flank pain and difficulty urinating.  Musculoskeletal: Denies myalgias, back pain, joint swelling, arthralgias and gait problem.  Skin: Denies pallor, rash and wound.  Neurological: Denies dizziness, seizures, syncope, weakness, light-headedness, numbness and headaches.  Hematological: Denies adenopathy. Easy bruising,  personal or family bleeding history  Psychiatric/Behavioral: Denies suicidal ideation, mood changes, confusion, nervousness, sleep disturbance and agitation  Past Medical History: Past Medical History:  Diagnosis Date   Anxiety    Arthritis    Fibromyalgia    Herniated lumbar intervertebral disc     Past Surgical History:  Procedure Laterality Date   ABDOMINAL HYSTERECTOMY     BILATERAL CARPAL TUNNEL RELEASE     BREAST BIOPSY Right 1997   benign - Dr Yolanda Bonine   RECTOVAGINAL FISTULA CLOSURE     SEPTOPLASTY      Medications: Prior to Admission medications   Medication Sig Start Date End Date Taking? Authorizing Provider  ascorbic acid (VITAMIN C) 1000 MG tablet Take 1,000 mg by mouth daily.   Yes [provider]  cholecalciferol (VITAMIN D3) 25 MCG (1000 UNIT) tablet Take 1,000 Units by mouth daily.   Yes [provider]  citalopram (CELEXA) 10 MG tablet Take 1 tablet (10 mg total) by mouth daily. Patient taking differently: Take 20 mg by mouth daily. 08/26/15 04/06/21 Yes Schaevitz, Myra Rude, MD  co-enzyme Q-10 30 MG capsule Take 30 mg by mouth 3 (three) times daily.   Yes [provider]  Garlic 10 MG CAPS Take 10 mg by mouth daily.   Yes [provider]  HYDROcodone-acetaminophen (NORCO/VICODIN) 5-325 MG tablet Take 1 tablet by mouth at bedtime as needed. May take up to every 6 hours as needed for pain if not working or driving 2/99/37  Yes [provider]  magnesium oxide (MAG-OX) 400 MG tablet Take 400 mg by mouth daily.   Yes [provider]  Menthol, Topical Analgesic, (BIOFREEZE ROLL-ON EX) Apply 1 application topically daily as needed.   Yes [provider]  Multiple Vitamin (MULTI-VITAMIN) tablet Take 1 tablet by mouth daily.   Yes [provider]  Omega-3 1000 MG CAPS Take 1 capsule by mouth daily.   Yes [provider]  predniSONE (DELTASONE) 20 MG tablet Take 40 mg by mouth daily. 5 days  02/17/21  Yes [provider]  triamcinolone (NASACORT ALLERGY 24HR) 55 MCG/ACT AERO nasal inhaler Place 2 sprays into the nose daily.   Yes [provider]  Turmeric (QC TUMERIC COMPLEX PO) Take 1 tablet by mouth daily.   Yes [provider]  zinc gluconate 50 MG tablet Take 50 mg by mouth daily.   Yes [provider]  cyclobenzaprine (FLEXERIL) 10 MG tablet Take 10 tablets by mouth 3 (three) times daily as needed. Patient not taking: Reported on 02/20/2021 02/15/21   [provider]    Allergies:   Allergies  Allergen Reactions   Codeine Nausea And Vomiting   Morphine And Related Itching   Penicillins Itching   Erythromycin Itching and Rash   Tetracyclines & Related Itching and Rash    Social History:  reports that she does not drink alcohol. No history on file for tobacco use and drug use.  Family History: Family History  Problem Relation Age of Onset   Breast cancer Paternal Grandmother 24   Breast cancer Other     Physical Exam:  Blood pressure (!) 183/115, pulse (!) 126, temperature 98.3 F (36.8 C), temperature source Oral, resp. rate 20, weight 73.3 kg, SpO2 96 %. General: Alert, awake, oriented x3, in no acute distress. Eyes: pink conjunctiva,anicteric sclera, pupils equal and reactive to light and accomodation, HEENT: normocephalic, atraumatic, oropharynx clear Neck: supple, no masses or lymphadenopathy, no goiter, no bruits, no JVD CVS: Regular rate and rhythm, without murmurs, rubs or gallops. No lower extremity edema Resp : Clear to auscultation bilaterally, no wheezing, rales or rhonchi. GI : Soft, nontender, nondistended, positive bowel sounds, no masses. No hepatomegaly. No hernia.  Musculoskeletal: No clubbing or cyanosis, positive pedal pulses. No contracture. ROM intact  Neuro: Left facial droop present. Forehead elevates symmetrically. Otherwise CN normal. Strength 5/5 in all extremities. Sensation grossly intact.  Normal finger to nose.  Psych: alert and oriented x 3, normal mood and affect Skin: no rashes or lesions, warm and dry   LABS on Admission: I have personally reviewed all the labs and imagings below    Basic Metabolic Panel: Recent Labs  Lab 02/20/21 1903  NA 140  K 3.2*  CL 104  CO2 28  GLUCOSE 123*  BUN 18  CREATININE 0.83  CALCIUM 9.4   Liver Function Tests: Recent Labs  Lab 02/20/21 1903  AST 22  ALT 20  ALKPHOS 60  BILITOT 0.7  PROT 7.2  ALBUMIN 4.1   No results for input(s): LIPASE, AMYLASE in the last 168 hours. No results for input(s): AMMONIA in the last 168 hours. CBC: Recent Labs  Lab 02/20/21 1903  WBC 12.5*  NEUTROABS 7.5  HGB 14.5  HCT 42.6  MCV 87.3  PLT 272   Cardiac Enzymes: No results for input(s): CKTOTAL, CKMB, CKMBINDEX, TROPONINI in the last 168 hours. BNP: Invalid input(s): POCBNP CBG: Recent Labs  Lab 02/20/21 1849  GLUCAP 138*    Radiological Exams on Admission:  CT HEAD CODE STROKE WO CONTRAST  Result Date: 02/20/2021 CLINICAL DATA:  Code stroke. Blurred vision left eye. Left-sided facial droop. EXAM: CT HEAD WITHOUT CONTRAST TECHNIQUE: Contiguous axial images were obtained from the base of the skull through the vertex without intravenous contrast. COMPARISON:  None. FINDINGS: Brain: No evidence of acute large vascular territory infarction, hemorrhage, hydrocephalus, extra-axial collection or mass lesion/mass effect. Vascular: No hyperdense vessel identified. Skull: No acute fracture. Sinuses/Orbits: Visualized sinuses are clear.  Unremarkable orbits. Other: No mastoid effusions. ASPECTS [facility]([place] Stroke Program Early CT Score) Total score (0-10 with 10 being normal): 10. IMPRESSION: No evidence of acute intracranial abnormality. ASPECTS is 10. Findings discussed with Dr. Fanny BienQuale at 7:08 p.m. via telephone. Electronically Signed   By: Feliberto HartsFrederick S Jones MD   On: 02/20/2021 19:12      EKG: Independently reviewed. Sinus tachycardia.  No evidence acute ischemia.    Assessment/Plan Active Problems:   Facial droop   Fibromyalgia   Mixed anxiety and depressive disorder   Prediabetes   Leukocytosis   Hypokalemia   History of COVID-19   Left Sided Facial Droop  Present for 1 hour prior to presentation. NIHSS = 2 for pronounced facial palsy with no other focal neuro deficits. tPA not administered 2/2 low NIHSS. Bell's palsy less likely given forehead elevates symmetrically. Neuro concerned for CVA/TIA. CT head with no acute intracranial abnormalities. Given ASA in ED.  -observation with telemetry  -neuro consulted, appreciate recs  -MRI/MRA brain  -US carotids  -echo  -check A1c and lipid panel  -start statin therapy as indicated  -ASA 81 mg daily  -  NPO pending swallow study  -PT/OT/SLP eval  -STAT head CT for any neuro changes -permissive HTN x48 hrs from sx onset or until stroke ruled out by MRI goal BP <220/110. PRN labetalol or hydralazine if BP above these parameters. Avoid oral antihypertensives.   Fibromyalgia  With recent flare up.  -continue Celexa -Hydrocodone prn pain control   Hx of Covid-19 Positive 3-4 weeks ago. Asymptomatic. Repeat COVID testing at admit neg. Currently on prednisone for URI and then subsequent concern for adrenal insuffiencey for abrupt d/c.  -continue Prednisone, start lower dose 20 mg daily tomorrow   Leukocytosis  WBC 12. No signs of infection. Suspect related to steroid use.  -monitor CBC   Hypokalemia  K 3.2  -Kdur 40 mEq -monitor BMET   DVT prophylaxis: Lovenox   CODE STATUS: FULL   Consults called: Neuro   Family Communication: Admission, patients condition and plan of care including tests being ordered have been discussed with the patient and patient's husband who indicates understanding and agree with the plan and Code Status  Admission status: Observation   The medical decision making on this patient was of high complexity and the patient is at high risk  for clinical deterioration, therefore this is a level 3 admission.  Severity of Illness:      The appropriate patient status for this patient is OBSERVATION. Observation status is judged to be reasonable and necessary in order to provide the required intensity of service to ensure the patient's safety. The patient's presenting symptoms, physical exam findings, and initial radiographic and laboratory data in the context of their medical condition is felt to place them at decreased risk for further clinical deterioration. Furthermore, it is anticipated that the patient will be medically stable for discharge from the hospital within 2 midnights of admission. The following factors support the patient status of observation.   " The patient's presenting symptoms include left sided facial droop. " The physical exam findings include left sided facial droop. " The initial radiographic and laboratory data are CT head neg, mild leukocytosis, K 3.2.   Time Spent on Admission: 42 minutes      De Hollingshead D.O.  [facility] 02/20/2021, 8:55 PM

## 2021-02-20 NOTE — ED Provider Notes (Signed)
[facility] Emergency Department Provider Note   ____________________________________________   Event Date/Time   First MD Initiated Contact with Patient 02/20/21 1857     (approximate)  I have reviewed the triage vital signs and the nursing notes.   HISTORY  Chief Complaint Code Stroke    HPI April Lindsey is a 65 y.o. female history of fibromyalgia  Patient reports that 21 days ago roughly she had COVID she fully recovered from that.  She was feeling well except started have a flareup of her "fibromyalgia" about a week ago which has started to feel better with prednisone treatment, but she had to be placed back upon it.   About 1 hour prior to arrival she noticed that the left side of her face was drooping.  She also associates that she has noticed a slight tingling in and out from around her left arm her left forearm off and on for the last few days intermittently.  It went away  Facial droop has persisted for about 1 hour prior to arrival  No headache no fevers or chills.  Had COVID about 3 weeks ago.  Also being treated now with prednisone for her fibromyalgia flareup  No tick bites.  No pain in her ears no facial rashes.  No hearing changes.  Has noticed a slight change in taste and is for the last week felt a "metallic" taste in her mouth    Past Medical History:  Diagnosis Date   Anxiety    Arthritis    Fibromyalgia    Herniated lumbar intervertebral disc     There are no problems to display for this patient.   Past Surgical History:  Procedure Laterality Date   ABDOMINAL HYSTERECTOMY     BILATERAL CARPAL TUNNEL RELEASE     BREAST BIOPSY Right 1997   benign - Dr Daymon Larsen FISTULA CLOSURE     SEPTOPLASTY      Prior to Admission medications   Medication Sig Start Date End Date Taking? Authorizing Provider  ascorbic acid (VITAMIN C) 1000 MG tablet Take 1,000 mg by mouth daily.   Yes [provider]   cholecalciferol (VITAMIN D3) 25 MCG (1000 UNIT) tablet Take 1,000 Units by mouth daily.   Yes [provider]  citalopram (CELEXA) 10 MG tablet Take 1 tablet (10 mg total) by mouth daily. Patient taking differently: Take 20 mg by mouth daily. 08/26/15 04/06/21 Yes Schaevitz, Myra Rude, MD  co-enzyme Q-10 30 MG capsule Take 30 mg by mouth 3 (three) times daily.   Yes [provider]  Garlic 10 MG CAPS Take 10 mg by mouth daily.   Yes [provider]  HYDROcodone-acetaminophen (NORCO/VICODIN) 5-325 MG tablet Take 1 tablet by mouth at bedtime as needed. May take up to every 6 hours as needed for pain if not working or driving 01/16/60  Yes [provider]  magnesium oxide (MAG-OX) 400 MG tablet Take 400 mg by mouth daily.   Yes [provider]  Menthol, Topical Analgesic, (BIOFREEZE ROLL-ON EX) Apply 1 application topically daily as needed.   Yes [provider]  Multiple Vitamin (MULTI-VITAMIN) tablet Take 1 tablet by mouth daily.   Yes [provider]  Omega-3 1000 MG CAPS Take 1 capsule by mouth daily.   Yes [provider]  predniSONE (DELTASONE) 20 MG tablet Take 40 mg by mouth daily. 5 days 02/17/21  Yes [provider]  triamcinolone (NASACORT ALLERGY 24HR) 55 MCG/ACT AERO  nasal inhaler Place 2 sprays into the nose daily.   Yes [provider]  Turmeric (QC TUMERIC COMPLEX PO) Take 1 tablet by mouth daily.   Yes [provider]  zinc gluconate 50 MG tablet Take 50 mg by mouth daily.   Yes [provider]  cyclobenzaprine (FLEXERIL) 10 MG tablet Take 10 tablets by mouth 3 (three) times daily as needed. Patient not taking: Reported on 02/20/2021 02/15/21   [provider]    Allergies Codeine, Morphine and related, Penicillins, Erythromycin, and Tetracyclines & related  Family History  Problem Relation Age of Onset   Breast cancer Paternal Grandmother 46   Breast cancer Other      Social History Social History   Tobacco Use   Smoking status: Never  Substance Use Topics   Alcohol use: No    Review of Systems Constitutional: No fever/chills Eyes: No visual changes. ENT: No sore throat. Cardiovascular: Denies chest pain. Respiratory: Denies shortness of breath. Gastrointestinal: No abdominal pain.   Genitourinary: Negative for dysuria. Musculoskeletal: Negative for back pain. Skin: Negative for rash. Neurological: Negative for headaches    ____________________________________________   PHYSICAL EXAM:  VITAL SIGNS: ED Triage Vitals  Enc Vitals Group     BP 02/20/21 1850 (!) 183/115     Pulse Rate 02/20/21 1850 (!) 126     Resp 02/20/21 1850 20     Temp 02/20/21 1850 98.3 F (36.8 C)     Temp Source 02/20/21 1850 Oral     SpO2 02/20/21 1850 96 %     Weight 02/20/21 1901 161 lb 11.2 oz (73.3 kg)     Height --      Head Circumference --      Peak Flow --      Pain Score --      Pain Loc --      Pain Edu? --      Excl. in GC? --     Constitutional: Alert and oriented. Well appearing and in no acute distress. Eyes: Conjunctivae are normal. Head: Atraumatic. Nose: No congestion/rhinnorhea. Mouth/Throat: Mucous membranes are moist. Neck: No stridor.  Cardiovascular: Normal rate, regular rhythm. Grossly normal heart sounds.  Good peripheral circulation. Respiratory: Normal respiratory effort.  No retractions. Lungs CTAB. Gastrointestinal: Soft and nontender. No distention. Musculoskeletal: No lower extremity tenderness nor edema. Neurologic:  Normal speech and language. No gross focal neurologic deficits are appreciated. NIH = 1 (left facial weakness that SPARES temporalis). VAN negative Skin:  Skin is warm, dry and intact. No rash noted. Psychiatric: Mood and affect are normal. Speech and behavior are normal.  ____________________________________________   LABS (all labs ordered are listed, but only abnormal results are  displayed)  Labs Reviewed  CBC - Abnormal; Notable for the following components:      Result Value   WBC 12.5 (*)    All other components within normal limits  COMPREHENSIVE METABOLIC PANEL - Abnormal; Notable for the following components:   Potassium 3.2 (*)    Glucose, Bld 123 (*)    All other components within normal limits  CBG MONITORING, ED - Abnormal; Notable for the following components:   Glucose-Capillary 138 (*)    All other components within normal limits  RESP PANEL BY RT-PCR (FLU A&B, COVID) ARPGX2  PROTIME-INR  APTT  DIFFERENTIAL   ____________________________________________  EKG  Reviewed inter by me at 1910 Heart rate 120 QRS 85 QTc 470 Sinus tachycardia no evidence acute ischemia ____________________________________________  RADIOLOGY  CT HEAD  CODE STROKE WO CONTRAST  Result Date: 02/20/2021 CLINICAL DATA:  Code stroke. Blurred vision left eye. Left-sided facial droop. EXAM: CT HEAD WITHOUT CONTRAST TECHNIQUE: Contiguous axial images were obtained from the base of the skull through the vertex without intravenous contrast. COMPARISON:  None. FINDINGS: Brain: No evidence of acute large vascular territory infarction, hemorrhage, hydrocephalus, extra-axial collection or mass lesion/mass effect. Vascular: No hyperdense vessel identified. Skull: No acute fracture. Sinuses/Orbits: Visualized sinuses are clear.  Unremarkable orbits. Other: No mastoid effusions. ASPECTS [facility] Stroke Program Early CT Score) Total score (0-10 with 10 being normal): 10. IMPRESSION: No evidence of acute intracranial abnormality. ASPECTS is 10. Findings discussed with Dr. Fanny Bien at 7:08 p.m. via telephone. Electronically Signed   By: Feliberto Harts MD   On: 02/20/2021 19:12     CT head reviewed negative for acute findings.  Discussed with radiologist as well as Dr. Selina Cooley at the bedside ____________________________________________   PROCEDURES  Procedure(s) performed:  None  Procedures  Critical Care performed: Yes, see critical care note(s)  CRITICAL CARE Performed by: Sharyn Creamer   Total critical care time: 25 minutes  Critical care time was exclusive of separately billable procedures and treating other patients.  Critical care was necessary to treat or prevent imminent or life-threatening deterioration.  Critical care was time spent personally by me on the following activities: development of treatment plan with patient and/or surrogate as well as nursing, discussions with consultants, evaluation of patient's response to treatment, examination of patient, obtaining history from patient or surrogate, ordering and performing treatments and interventions, ordering and review of laboratory studies, ordering and review of radiographic studies, pulse oximetry and re-evaluation of patient's condition.  ____________________________________________   INITIAL IMPRESSION / ASSESSMENT AND PLAN / ED COURSE  Pertinent labs & imaging results that were available during my care of the patient were reviewed by me and considered in my medical decision making (see chart for details).   Patient presents with sudden onset left-sided facial weakness for about 1 hour.  This is associated with a feeling of intermittent tingling in the left arm for the last couple days with resolution of that.  On exam she has sparing of the left temporalis which is concerning for central cause though her presentation seems to suggest also a high potential for Bell's palsy.  There is no obvious rash there is no obvious etiology such as HSV, zoster etc. at this point.  Patient was seen and activated as a code stroke due to concerns for an acute central neurologic etiology.  Seen by neurology Dr. Selina Cooley in the ER, she recommends at this point after review of imaging administration of salicylate, and admission to the hospital for further work-up for neurologic deficit etiology is not yet clear,  neurology involved, but differential certainly includes possible stroke TIA, Bell's palsy etc.  As far as the patient's recent COVID, she reports resolution of those symptoms she is afebrile no cough.  I suspect she is cleared from her COVID infection at this point but also is on high-dose prednisone now for fibromyalgia flareup.  ----------------------------------------- 8:23 PM on 02/20/2021 ----------------------------------------- Labs this point revealed mild leukocytosis which is likely in this case related to steroid use.  She denies infectious symptoms or fever.  COVID panel pending and but anticipate her COVID test may return positive though I think she is beyond her infectious window.  Mild hypokalemia  Admission discussed with hospitalist      ____________________________________________   FINAL CLINICAL IMPRESSION(S) / ED DIAGNOSES  Final diagnoses:  Acute focal neurological deficit        Note:  This document was prepared using Dragon voice recognition software and may include unintentional dictation errors       Sharyn CreamerQuale, Mikela Senn, MD 02/20/21 2025

## 2021-02-21 ENCOUNTER — Observation Stay: Payer: BLUE CROSS/BLUE SHIELD

## 2021-02-21 ENCOUNTER — Encounter: Payer: Self-pay | Admitting: Internal Medicine

## 2021-02-21 ENCOUNTER — Observation Stay (HOSPITAL_BASED_OUTPATIENT_CLINIC_OR_DEPARTMENT_OTHER)
Admit: 2021-02-21 | Discharge: 2021-02-21 | Disposition: A | Discharge status: Home / Self Care | Payer: BLUE CROSS/BLUE SHIELD | Attending: Internal Medicine | Admitting: Internal Medicine

## 2021-02-21 DIAGNOSIS — Z8616 Personal history of COVID-19: Secondary | ICD-10-CM

## 2021-02-21 DIAGNOSIS — I6389 Other cerebral infarction: Secondary | ICD-10-CM | POA: Diagnosis not present

## 2021-02-21 DIAGNOSIS — D72829 Elevated white blood cell count, unspecified: Secondary | ICD-10-CM

## 2021-02-21 DIAGNOSIS — M797 Fibromyalgia: Secondary | ICD-10-CM | POA: Diagnosis not present

## 2021-02-21 DIAGNOSIS — R29818 Other symptoms and signs involving the nervous system: Secondary | ICD-10-CM | POA: Diagnosis not present

## 2021-02-21 DIAGNOSIS — E876 Hypokalemia: Secondary | ICD-10-CM

## 2021-02-21 LAB — CBC
HCT: 39.2 % (ref 36.0–46.0)
Hemoglobin: 13.4 g/dL (ref 12.0–15.0)
MCH: 29.9 pg (ref 26.0–34.0)
MCHC: 34.2 g/dL (ref 30.0–36.0)
MCV: 87.5 fL (ref 80.0–100.0)
Platelets: 242 10*3/uL (ref 150–400)
RBC: 4.48 MIL/uL (ref 3.87–5.11)
RDW: 13 % (ref 11.5–15.5)
WBC: 11.7 10*3/uL — ABNORMAL HIGH (ref 4.0–10.5)
nRBC: 0 % (ref 0.0–0.2)

## 2021-02-21 LAB — LIPID PANEL
Cholesterol: 209 mg/dL — ABNORMAL HIGH (ref 0–200)
HDL: 61 mg/dL (ref >40)
LDL Cholesterol: 112 mg/dL — ABNORMAL HIGH (ref 0–99)
Total CHOL/HDL Ratio: 3.4 RATIO
Triglycerides: 179 mg/dL — ABNORMAL HIGH (ref <150)
VLDL: 36 mg/dL (ref 0–40)

## 2021-02-21 LAB — ECHOCARDIOGRAM COMPLETE
AR max vel: 2.37 cm2
AV Area VTI: 2.67 cm2
AV Area mean vel: 2.4 cm2
AV Mean grad: 3.5 mmHg
AV Peak grad: 5.8 mmHg
Ao pk vel: 1.2 m/s
Area-P 1/2: 5.84 cm2
Height: 62 in
S' Lateral: 2.63 cm
Weight: 2560 oz

## 2021-02-21 LAB — BASIC METABOLIC PANEL
Anion gap: 7 (ref 5–15)
BUN: 15 mg/dL (ref 8–23)
CO2: 27 mmol/L (ref 22–32)
Calcium: 8.6 mg/dL — ABNORMAL LOW (ref 8.9–10.3)
Chloride: 107 mmol/L (ref 98–111)
Creatinine, Ser: 0.58 mg/dL (ref 0.44–1.00)
GFR, Estimated: >60 mL/min (ref >60)
Glucose, Bld: 99 mg/dL (ref 70–99)
Potassium: 3.8 mmol/L (ref 3.5–5.1)
Sodium: 141 mmol/L (ref 135–145)

## 2021-02-21 LAB — HEMOGLOBIN A1C
Hgb A1c MFr Bld: 5.5 % (ref 4.8–5.6)
Mean Plasma Glucose: 111.15 mg/dL

## 2021-02-21 LAB — HIV ANTIBODY (ROUTINE TESTING W REFLEX): HIV Screen 4th Generation wRfx: NONREACTIVE

## 2021-02-21 LAB — GLUCOSE, CAPILLARY: Glucose-Capillary: 122 mg/dL — ABNORMAL HIGH (ref 70–99)

## 2021-02-21 IMAGING — MR MR HEAD W/O CM
12 series · 48 of 48 positions shown · non-contrast
Comparison: No pertinent prior exam.

CLINICAL DATA: Facial droop

EXAM:
MRI HEAD WITHOUT CONTRAST
MRA HEAD WITHOUT CONTRAST
TECHNIQUE: Multiplanar, multi-echo pulse sequences of the brain and surrounding
structures were acquired without intravenous contrast. Angiographic
images of the Circle of Willis were acquired using MRA technique
without intravenous contrast.

[Series 5: ax dwi_tracew · axial · 3.0mm · 0.65mm/px · z∈[-130,+22]mm · 3 of 48 slices shown]
[im 1/48]
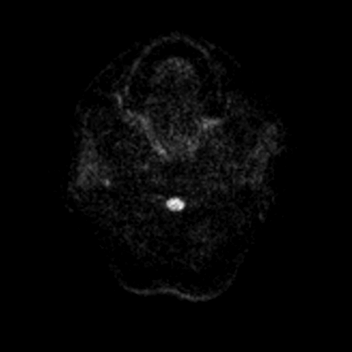
[im 24/48]
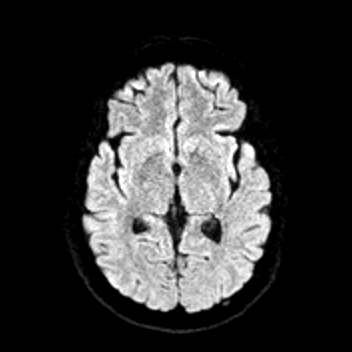
[im 48/48]
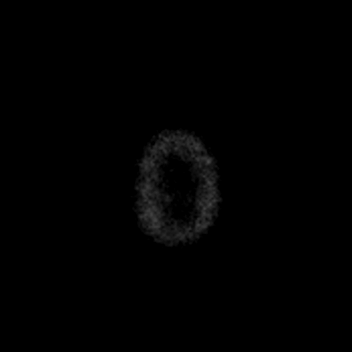

[Series 6: ax dwi_adc · axial · 3.0mm · 0.65mm/px · z∈[-130,+22]mm · 4 of 48 slices shown]
[im 1/48]
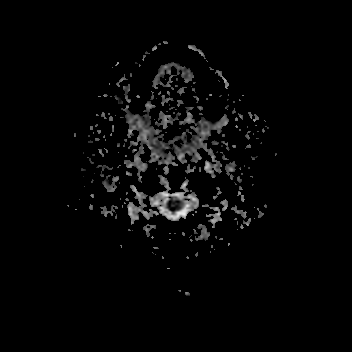
[im 16/48]
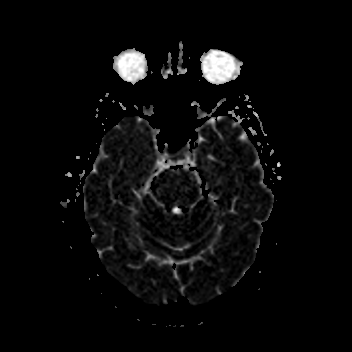
[im 32/48]
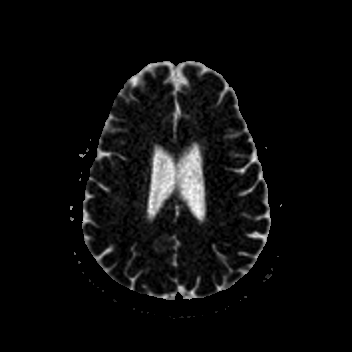
[im 48/48]
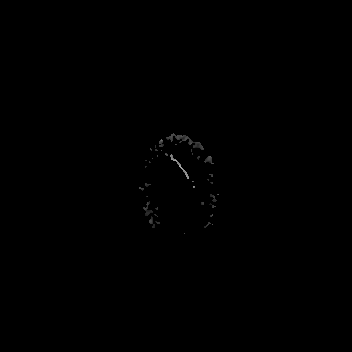

[Series 7: cor dwi_tracew · coronal · 5.0mm · 0.68mm/px · 3 of 40 slices shown]
[im 1/40]
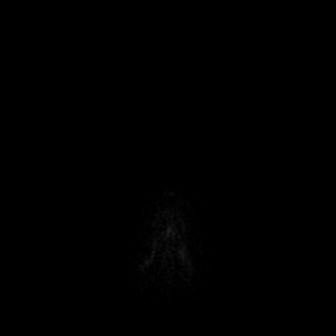
[im 20/40]
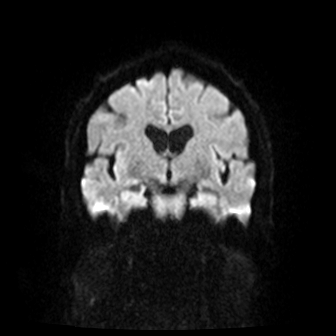
[im 40/40]
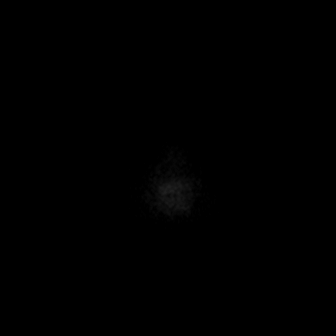

[Series 8: cor dwi_adc · coronal · 5.0mm · 0.68mm/px · 3 of 40 slices shown]
[im 1/40]
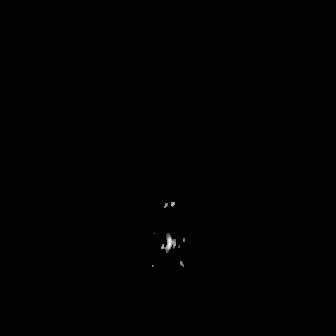
[im 20/40]
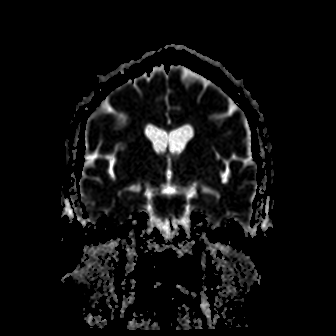
[im 40/40]
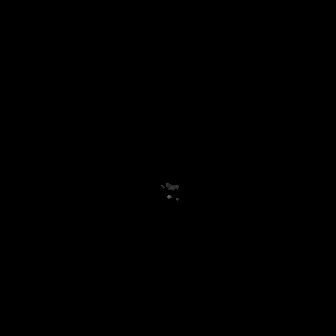

[Series 9: T1 · sagittal · 5.0mm · 0.62mm/px · 2 of 21 slices shown (1 of 2)]
[im 1/21]
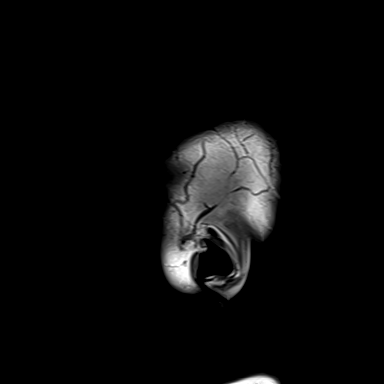
[im 21/21]
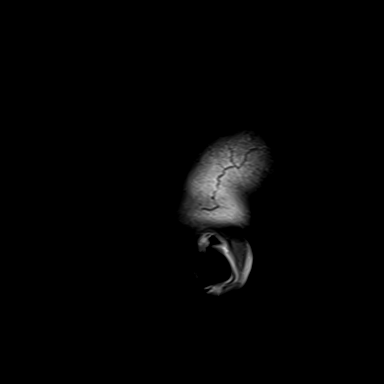

[Series 10: T2 · axial · 5.0mm · 0.53mm/px · z∈[-129,+24]mm · 2 of 27 slices shown (1 of 2)]
[im 1/27]
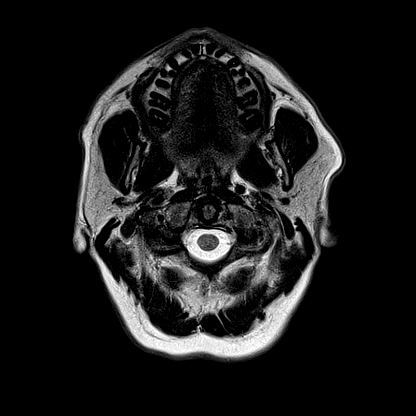
[im 27/27]
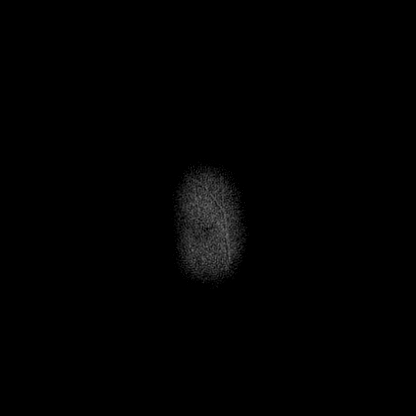

[Series 11: mag_images · axial · 3.0mm · 0.90mm/px · z∈[-139,+35]mm · 4 of 60 slices shown]
[im 1/60]
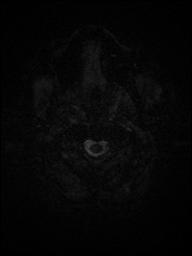
[im 20/60]
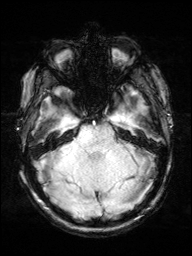
[im 40/60]
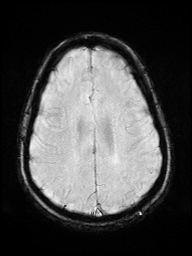
[im 60/60]
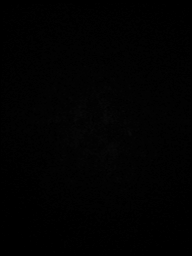

[Series 12: pha_images · axial · 3.0mm · 0.90mm/px · z∈[-139,+23]mm · 4 of 56 slices shown]
[im 1/56]
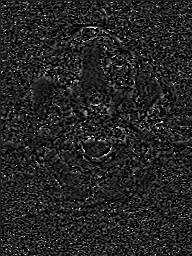
[im 19/56]
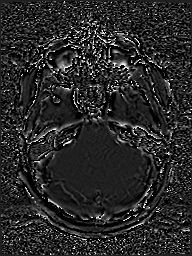
[im 37/56]
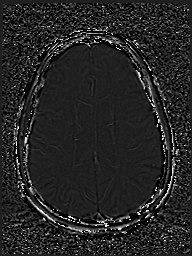
[im 56/56]
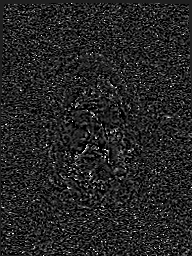

[Series 13: swi_images · axial · 3.0mm · 0.90mm/px · z∈[-139,+35]mm · 4 of 60 slices shown]
[im 1/60]
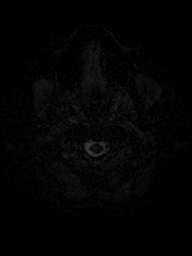
[im 20/60]
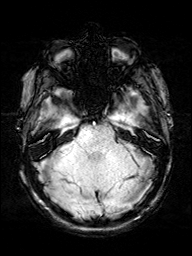
[im 40/60]
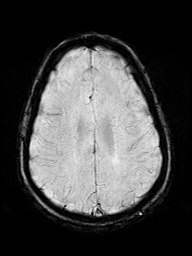
[im 60/60]
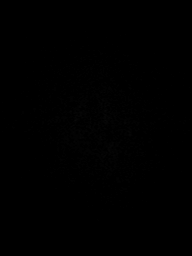

[Series 15: FLAIR · axial · 3.0mm · 0.53mm/px · z∈[-131,+28]mm · 4 of 55 slices shown]
[im 1/55]
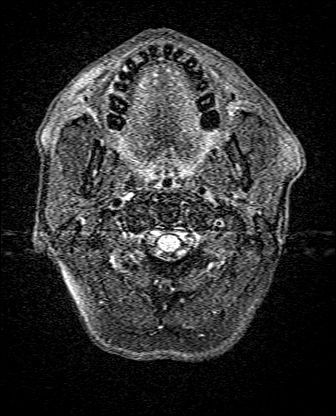
[im 19/55]
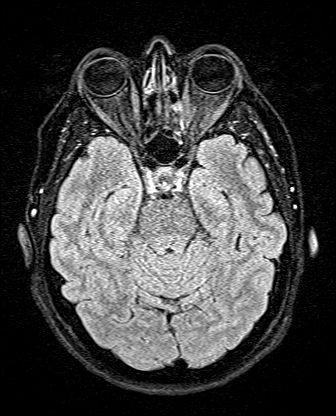
[im 37/55]
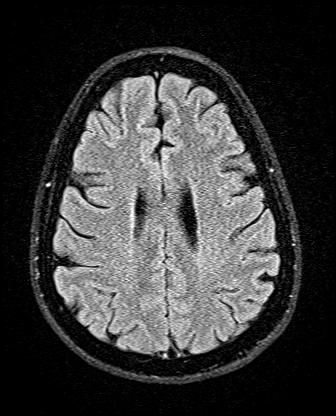
[im 55/55]
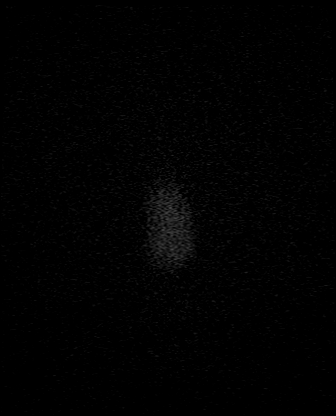

[Series 16: T1 · axial · 1.0mm · 0.98mm/px · z∈[-135,+36]mm · 13 of 173 slices shown (2 of 2)]
[im 1/173]
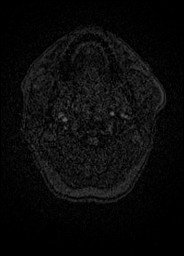
[im 15/173]
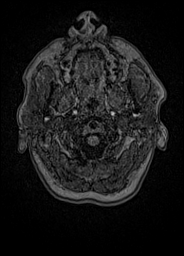
[im 29/173]
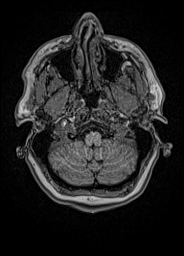
[im 44/173]
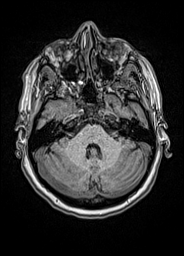
[im 58/173]
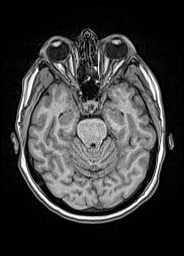
[im 72/173]
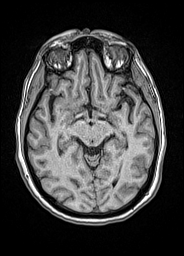
[im 87/173]
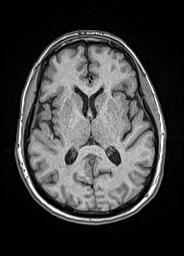
[im 101/173]
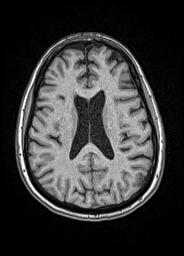
[im 115/173]
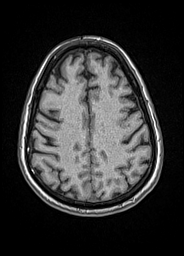
[im 130/173]
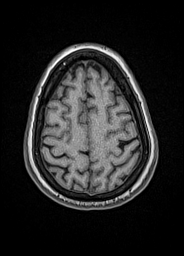
[im 144/173]
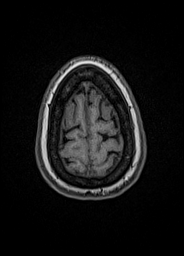
[im 158/173]
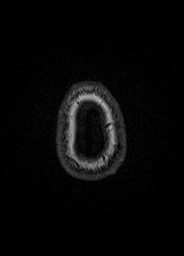
[im 173/173]
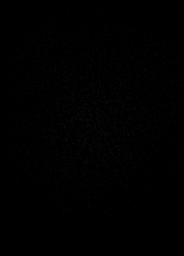

[Series 17: T2 · coronal · 5.0mm · 0.57mm/px · 2 of 29 slices shown (2 of 2)]
[im 1/29]
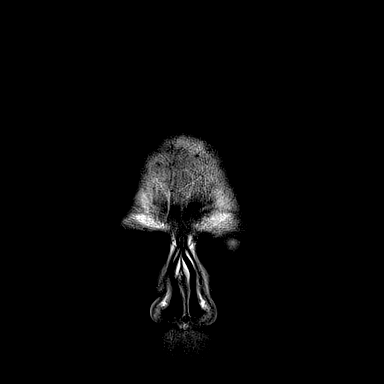
[im 29/29]
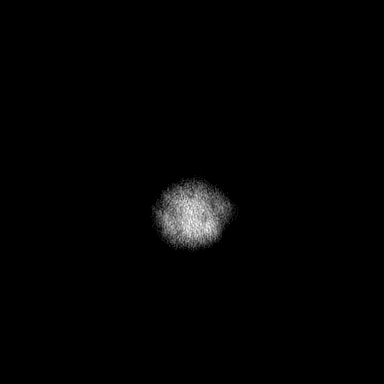

[48 of 48 positions shown; findings below may reference images not displayed]

FINDINGS: MRI HEAD

Brain: There is no acute infarction or intracranial hemorrhage.
There is no intracranial mass, mass effect, or edema. There is no
hydrocephalus or extra-axial fluid collection. Ventricles and sulci
are within normal limits in size and configuration. A few small foci
of T2 hyperintensity in the supratentorial white matter nonspecific
but may reflect minor chronic microvascular ischemic changes.

Vascular: Major vessel flow voids at the skull base are preserved.

Skull and upper cervical spine: Normal marrow signal is preserved.

Sinuses/Orbits: Mild mucosal thickening.  Orbits are unremarkable.

Other: Sella is unremarkable.  Mastoid air cells are clear.

MRA HEAD

Intracranial internal carotid arteries are patent. Middle and
anterior cerebral arteries are patent. Intracranial vertebral
arteries, basilar artery, posterior cerebral arteries are patent.
Right posterior communicating artery is present. There is no
significant stenosis or aneurysm.
IMPRESSION: No evidence of recent infarction, hemorrhage, or mass.

No proximal intracranial vessel occlusion or significant stenosis.

## 2021-02-21 IMAGING — MR MR MRA HEAD W/O CM
1 series · 19 of 48 positions shown · non-contrast
Comparison: No pertinent prior exam.

CLINICAL DATA: Facial droop

EXAM:
MRI HEAD WITHOUT CONTRAST
MRA HEAD WITHOUT CONTRAST
TECHNIQUE: Multiplanar, multi-echo pulse sequences of the brain and surrounding
structures were acquired without intravenous contrast. Angiographic
images of the Circle of Willis were acquired using MRA technique
without intravenous contrast.

[Series 5: TOF · axial · 0.5mm · 0.41mm/px · z∈[-119,-24]mm · 19 of 205 slices shown]
[im 1/205]
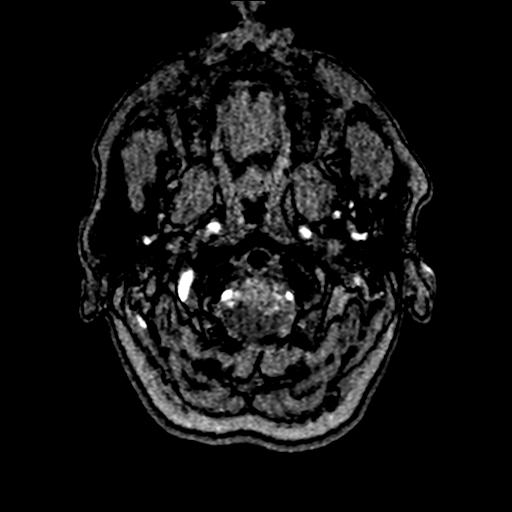
[im 5/205]
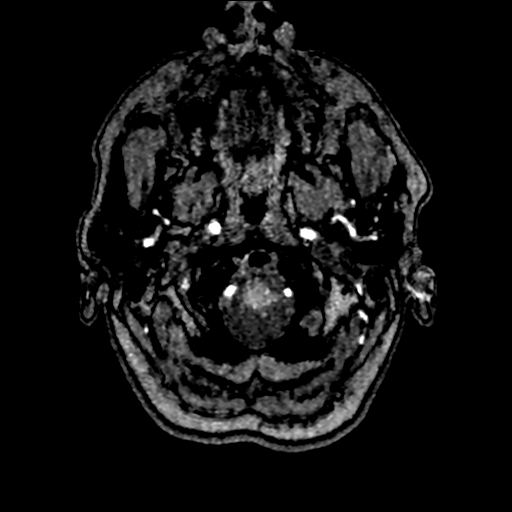
[im 9/205]
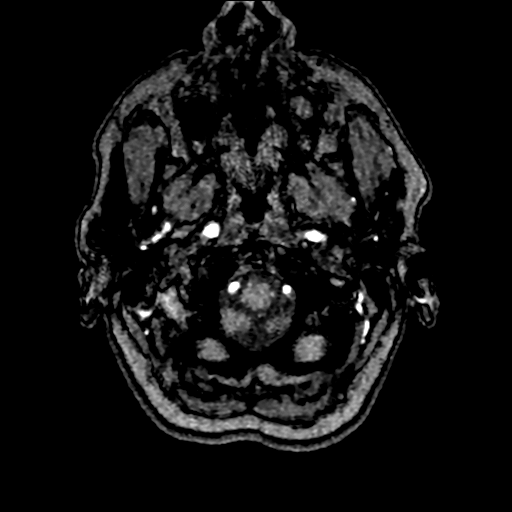
[im 14/205]
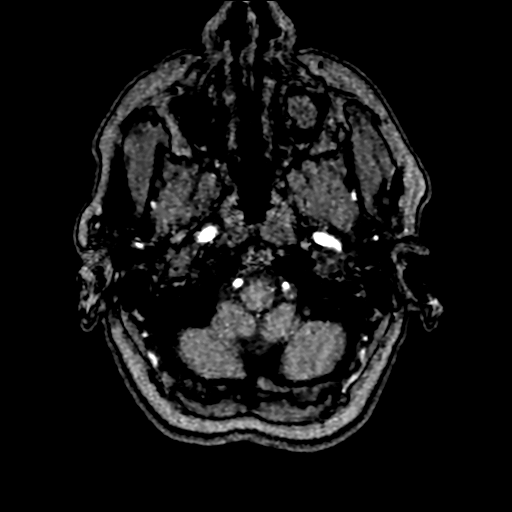
[im 18/205]
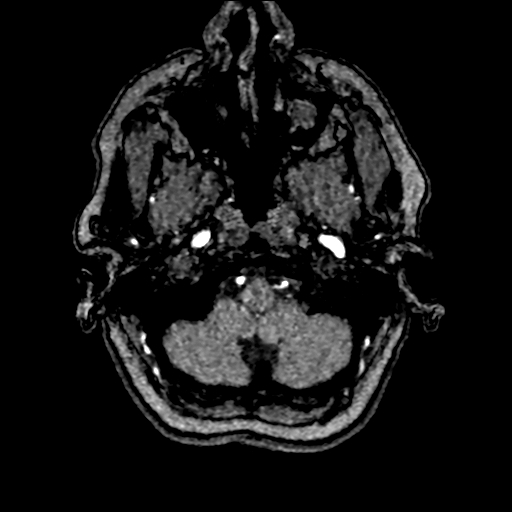
[im 22/205]
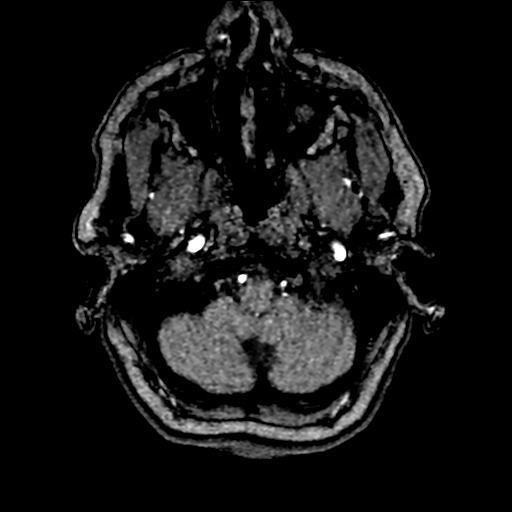
[im 27/205]
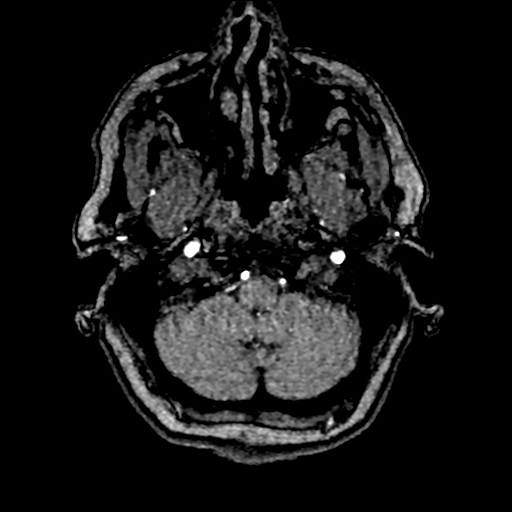
[im 31/205]
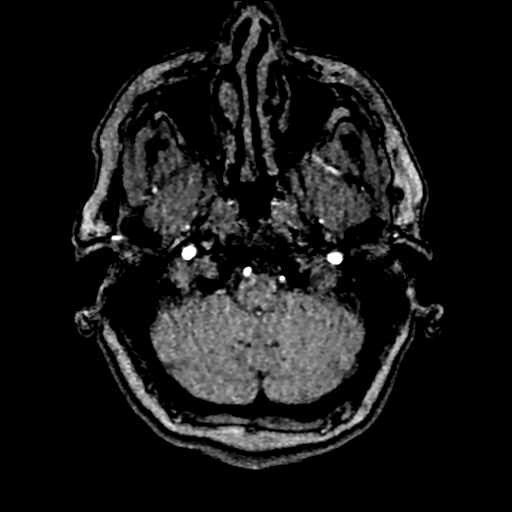
[im 35/205]
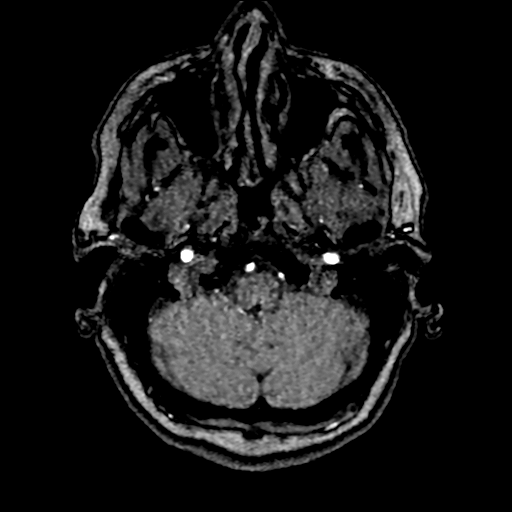
[im 40/205]
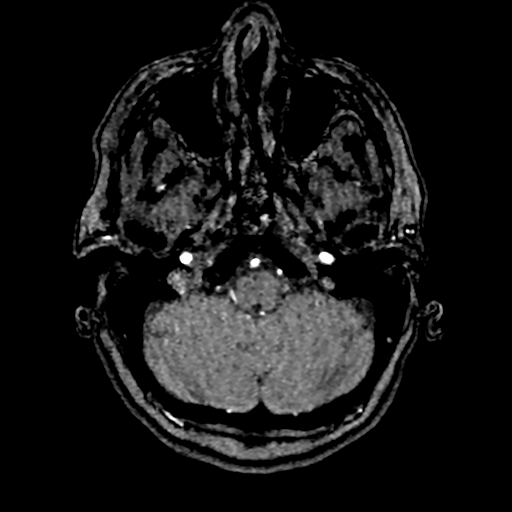
[im 44/205]
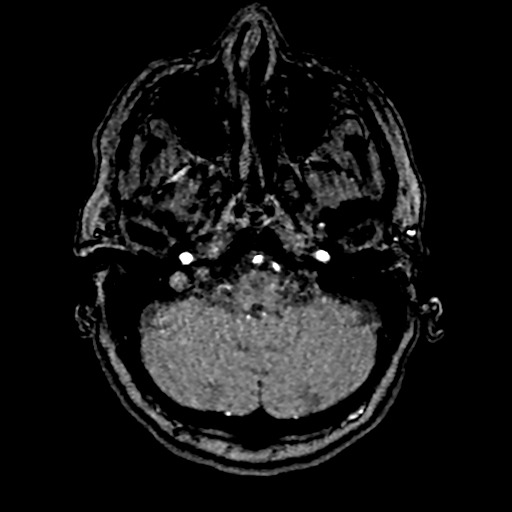
[im 66/205]
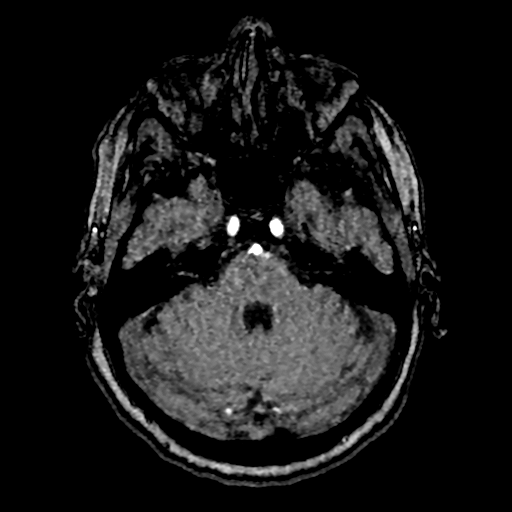
[im 92/205]
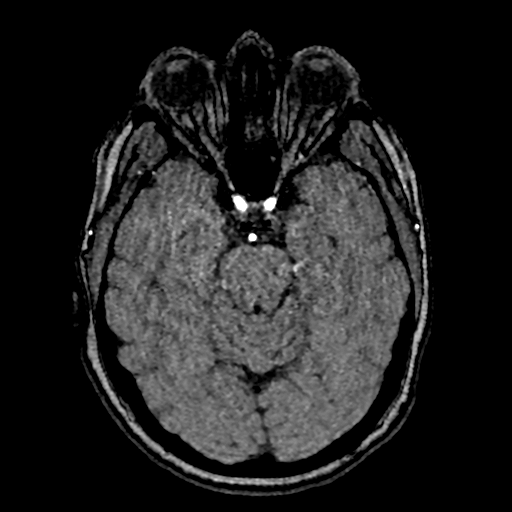
[im 105/205]
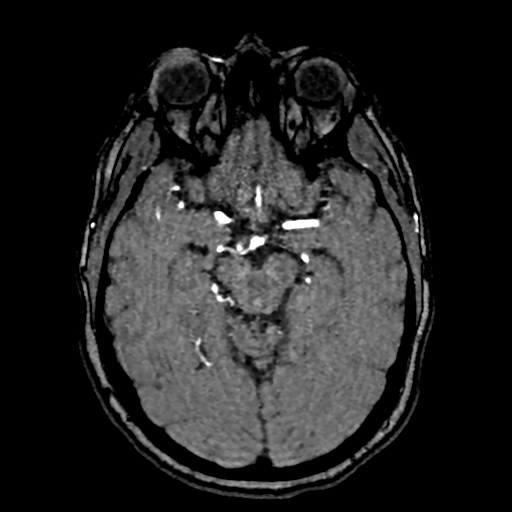
[im 118/205]
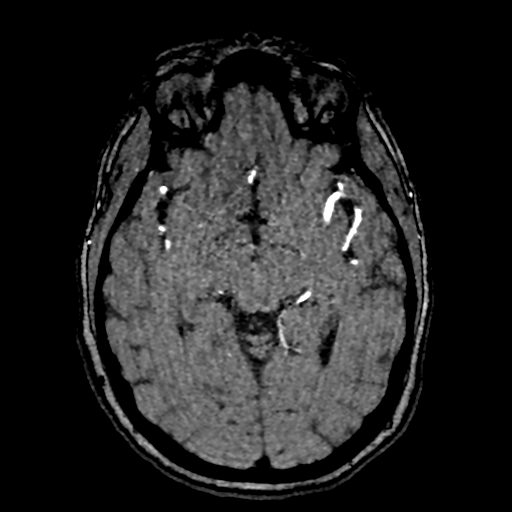
[im 144/205]
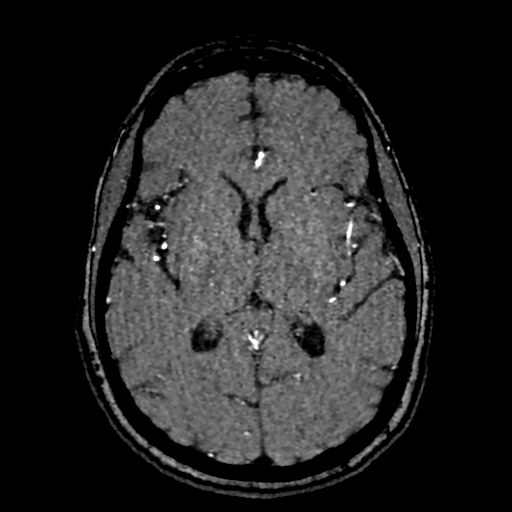
[im 170/205]
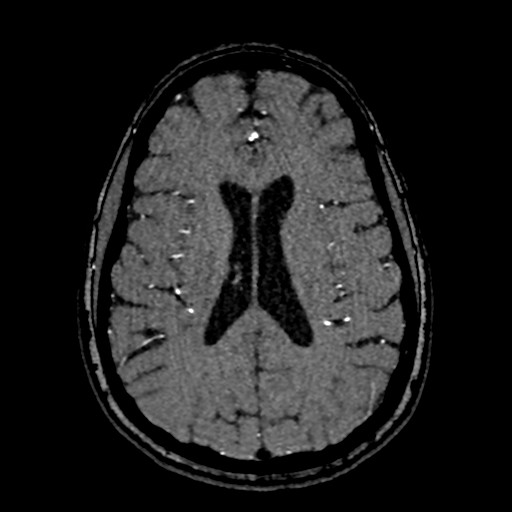
[im 174/205]
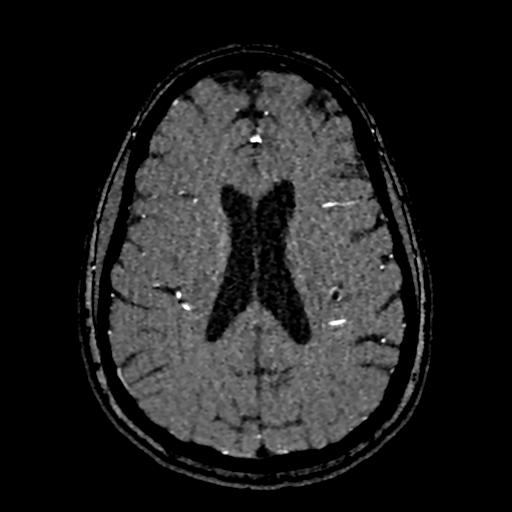
[im 196/205]
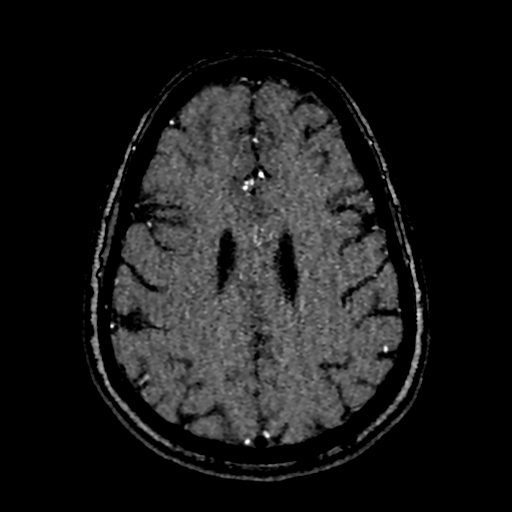

[19 of 48 positions shown; findings below may reference images not displayed]

FINDINGS: MRI HEAD

Brain: There is no acute infarction or intracranial hemorrhage.
There is no intracranial mass, mass effect, or edema. There is no
hydrocephalus or extra-axial fluid collection. Ventricles and sulci
are within normal limits in size and configuration. A few small foci
of T2 hyperintensity in the supratentorial white matter nonspecific
but may reflect minor chronic microvascular ischemic changes.

Vascular: Major vessel flow voids at the skull base are preserved.

Skull and upper cervical spine: Normal marrow signal is preserved.

Sinuses/Orbits: Mild mucosal thickening.  Orbits are unremarkable.

Other: Sella is unremarkable.  Mastoid air cells are clear.

MRA HEAD

Intracranial internal carotid arteries are patent. Middle and
anterior cerebral arteries are patent. Intracranial vertebral
arteries, basilar artery, posterior cerebral arteries are patent.
Right posterior communicating artery is present. There is no
significant stenosis or aneurysm.
IMPRESSION: No evidence of recent infarction, hemorrhage, or mass.

No proximal intracranial vessel occlusion or significant stenosis.

## 2021-02-21 IMAGING — US US CAROTID DUPLEX BILAT
1 series · 13 of 24 positions shown · non-contrast
Comparison: None.

CLINICAL DATA: 65 year old female with history of facial droop

EXAM:
BILATERAL CAROTID DUPLEX ULTRASOUND
TECHNIQUE: Gray scale imaging, color Doppler and duplex ultrasound were
performed of bilateral carotid and vertebral arteries in the neck.

[Series 1: us carotid bilateral · 13 of 64 slices shown]
[im 1/64]
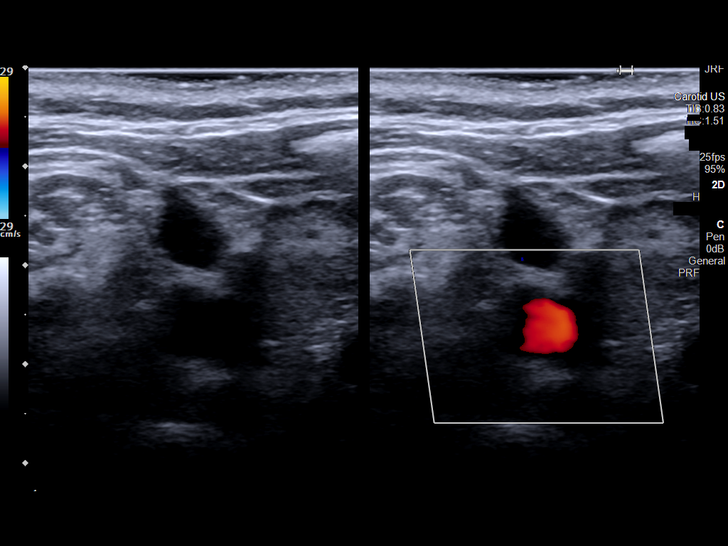
[im 6/64]
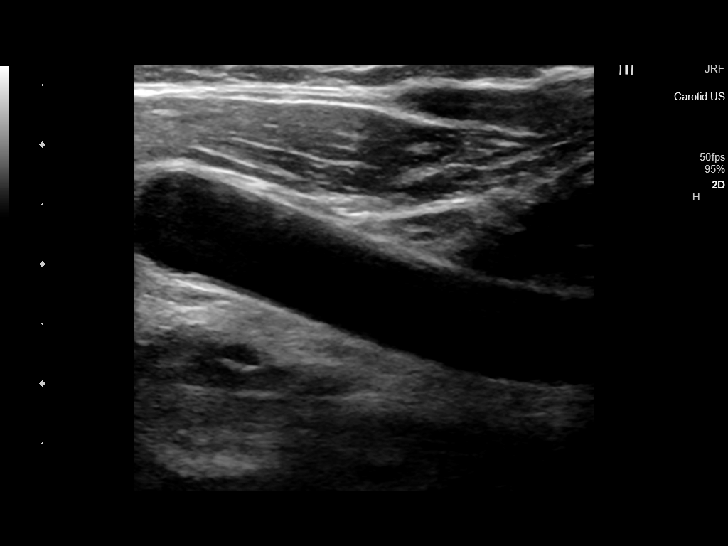
[im 11/64]
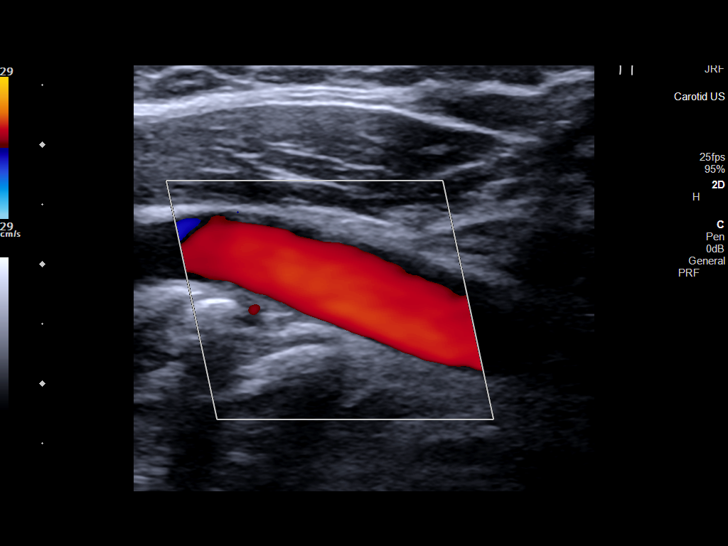
[im 17/64]
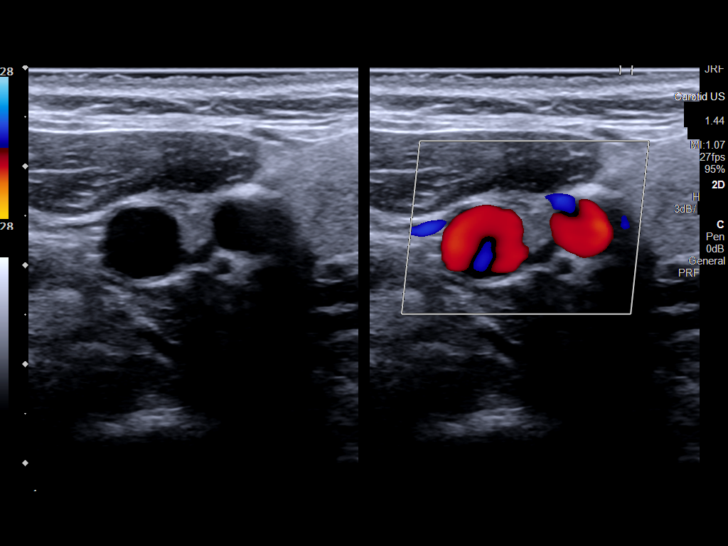
[im 22/64]
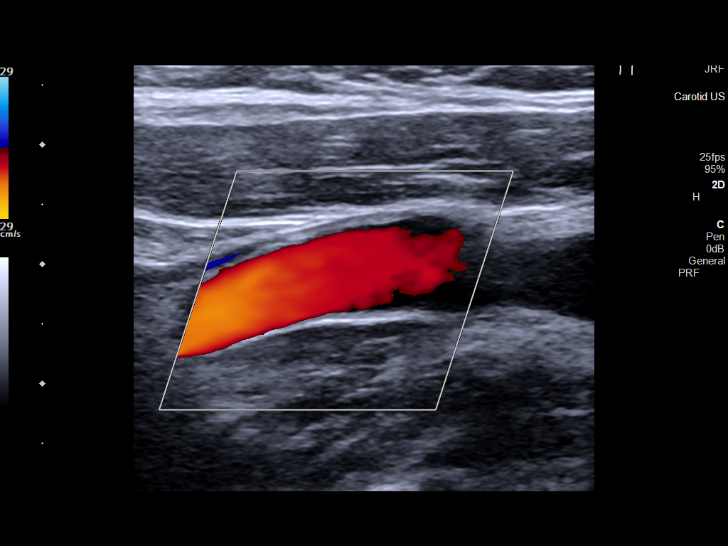
[im 28/64]
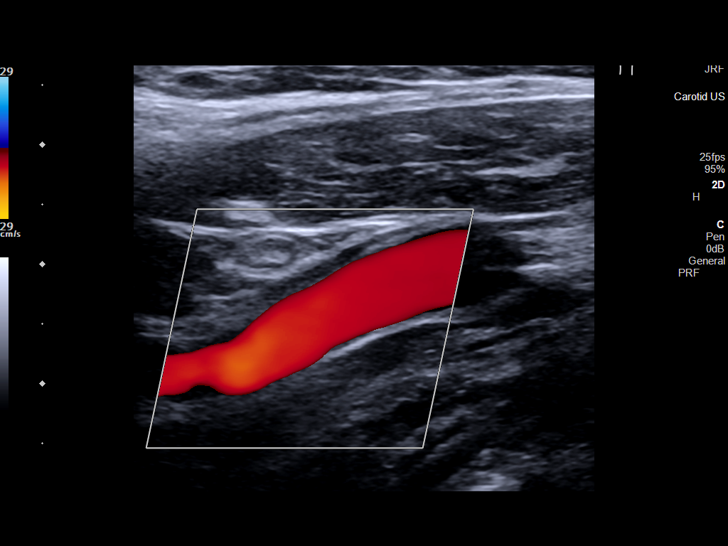
[im 33/64]
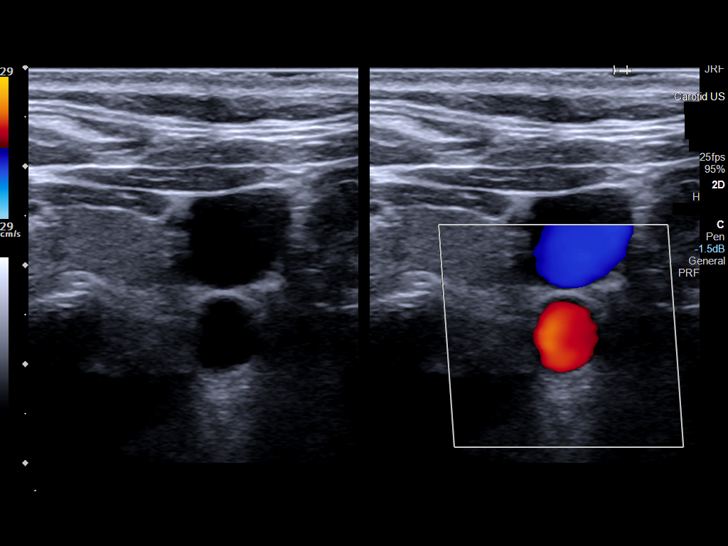
[im 36/64]
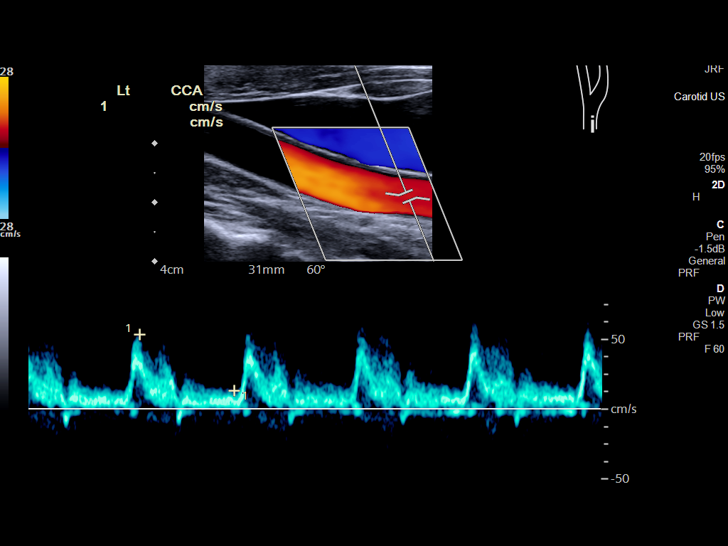
[im 42/64]
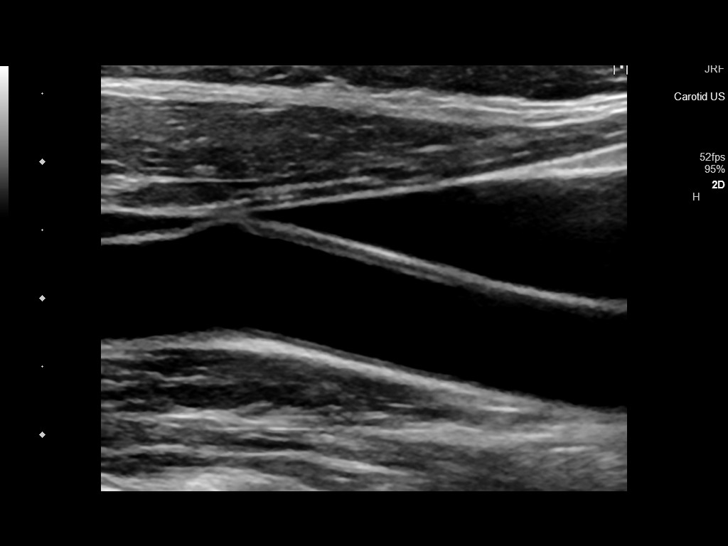
[im 47/64]
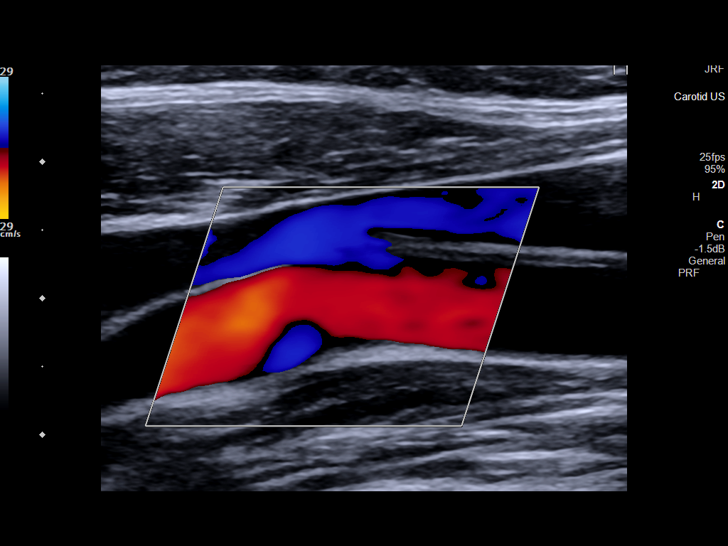
[im 53/64]
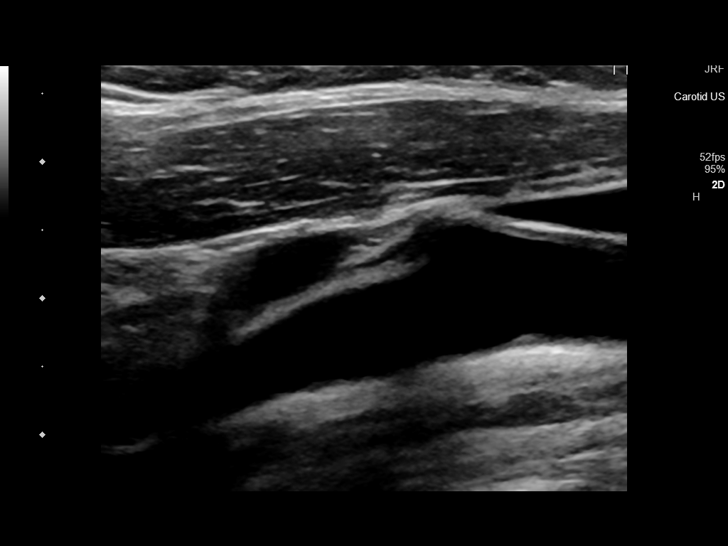
[im 58/64]
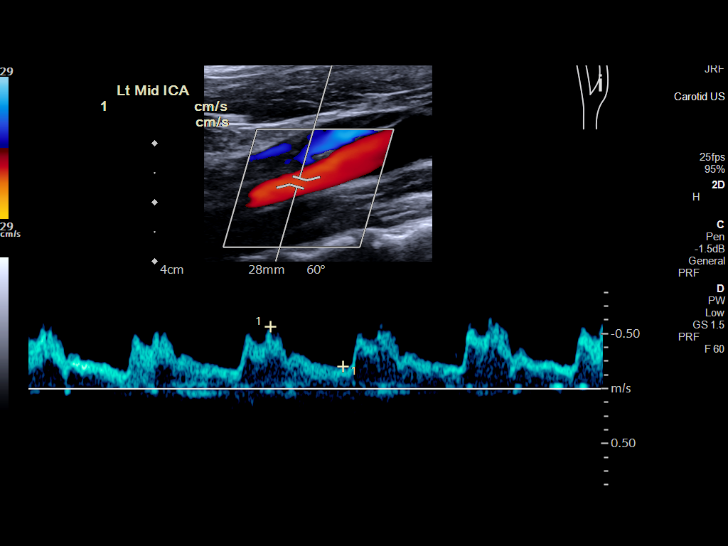
[im 64/64]
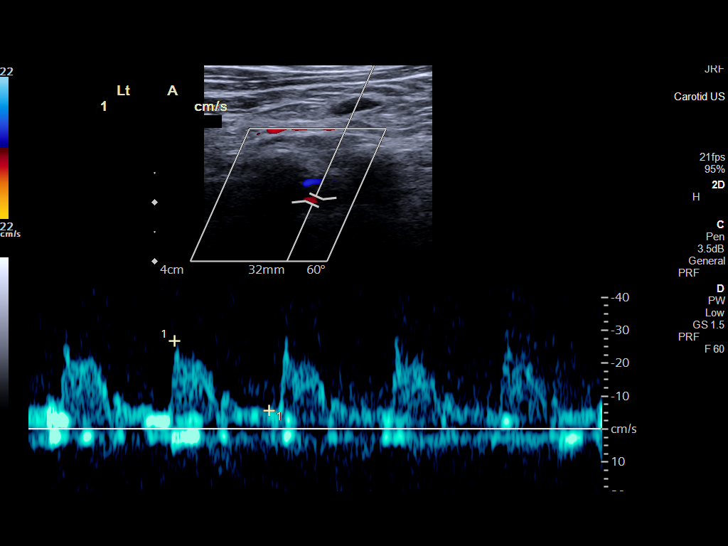

[13 of 24 positions shown; findings below may reference images not displayed]

FINDINGS: Criteria: Quantification of carotid stenosis is based on velocity
parameters that correlate the residual internal carotid diameter
with NASCET-based stenosis levels, using the diameter of the distal
internal carotid lumen as the denominator for stenosis measurement.

The following velocity measurements were obtained:

RIGHT

ICA:  Systolic 66 cm/sec, Diastolic 25 cm/sec

CCA:  64 cm/sec

SYSTOLIC ICA/CCA RATIO:

ECA:  63 cm/sec

LEFT

ICA:  Systolic 62 cm/sec, Diastolic 14 cm/sec

CCA:  62 cm/sec

SYSTOLIC ICA/CCA RATIO:

ECA:  67 cm/sec

Right Brachial SBP: Not acquired

Left Brachial SBP: Not acquired

RIGHT CAROTID ARTERY: No significant calcified disease of the right
common carotid artery. Intermediate waveform maintained. Homogeneous
plaque without significant calcifications at the right carotid
bifurcation. Low resistance waveform of the right ICA. No
significant tortuosity.

RIGHT VERTEBRAL ARTERY: Antegrade flow with low resistance waveform.

LEFT CAROTID ARTERY: No significant calcified disease of the left
common carotid artery. Intermediate waveform maintained. Homogeneous
plaque at the left carotid bifurcation without significant
calcifications. Low resistance waveform of the left ICA.

LEFT VERTEBRAL ARTERY:  Antegrade flow with low resistance waveform.
IMPRESSION: Color duplex indicates minimal homogeneous plaque, with no
hemodynamically significant stenosis by duplex criteria in the
extracranial cerebrovascular circulation.

## 2021-02-21 MED ORDER — ATORVASTATIN CALCIUM 20 MG PO TABS
40.0000 mg | ORAL_TABLET | Freq: Every day | ORAL | Status: DC
  Administered 2021-02-21 – 2021-02-22 (×2): 40 mg via ORAL
  Filled 2021-02-21 (×2): qty 2

## 2021-02-21 MED ORDER — KETOROLAC TROMETHAMINE 30 MG/ML IJ SOLN
30.0000 mg | Freq: Four times a day (QID) | INTRAMUSCULAR | Status: DC | PRN
  Administered 2021-02-21 – 2021-02-22 (×3): 30 mg via INTRAVENOUS
  Filled 2021-02-21 (×5): qty 1

## 2021-02-21 MED ORDER — LORAZEPAM 2 MG/ML IJ SOLN
0.5000 mg | Freq: Every day | INTRAMUSCULAR | Status: DC | PRN
  Administered 2021-02-21: 22:00:00 0.5 mg via INTRAVENOUS
  Filled 2021-02-21: qty 1

## 2021-02-21 MED ORDER — POLYETHYLENE GLYCOL 3350 17 G PO PACK
17.0000 g | PACK | Freq: Two times a day (BID) | ORAL | Status: DC
  Administered 2021-02-21 – 2021-02-22 (×2): 17 g via ORAL
  Filled 2021-02-21 (×2): qty 1

## 2021-02-21 MED ORDER — SENNOSIDES-DOCUSATE SODIUM 8.6-50 MG PO TABS: 1.0000 | ORAL_TABLET | Freq: Every day | ORAL | Status: DC

## 2021-02-21 MED ORDER — ASPIRIN EC 81 MG PO TBEC
81.0000 mg | DELAYED_RELEASE_TABLET | Freq: Every day | ORAL | Status: DC
  Administered 2021-02-21 – 2021-02-22 (×2): 81 mg via ORAL
  Filled 2021-02-21 (×2): qty 1

## 2021-02-21 MED ORDER — KETOROLAC TROMETHAMINE 30 MG/ML IJ SOLN
30.0000 mg | Freq: Once | INTRAMUSCULAR | Status: AC
  Administered 2021-02-21: 13:00:00 30 mg via INTRAVENOUS
  Filled 2021-02-21: qty 1

## 2021-02-21 MED ORDER — HYDROCODONE-ACETAMINOPHEN 5-325 MG PO TABS
1.0000 | ORAL_TABLET | Freq: Four times a day (QID) | ORAL | Status: DC | PRN
  Administered 2021-02-22: 09:00:00 2 via ORAL
  Filled 2021-02-21: qty 2

## 2021-02-21 MED ORDER — CYCLOBENZAPRINE HCL 10 MG PO TABS
5.0000 mg | ORAL_TABLET | Freq: Three times a day (TID) | ORAL | Status: DC | PRN
  Administered 2021-02-21: 5 mg via ORAL
  Filled 2021-02-21 (×2): qty 1

## 2021-02-21 MED ORDER — TIZANIDINE HCL 4 MG PO TABS
4.0000 mg | ORAL_TABLET | Freq: Three times a day (TID) | ORAL | Status: DC | PRN
  Administered 2021-02-21 – 2021-02-22 (×2): 4 mg via ORAL
  Filled 2021-02-21 (×3): qty 1

## 2021-02-21 MED ORDER — DICLOFENAC SODIUM 1 % EX GEL
2.0000 g | Freq: Four times a day (QID) | CUTANEOUS | Status: DC | PRN
  Administered 2021-02-21 – 2021-02-22 (×2): 2 g via TOPICAL
  Filled 2021-02-21 (×2): qty 100

## 2021-02-21 NOTE — Progress Notes (Signed)
Neurology Progress Note   Patient ID: 65 yo woman with hx fibromyalgia who presented as stroke code for acute onset L isolated L facial droop. She also reported intermittent LUE numbness approx 1 week ago that has since resolved.  Interval data: - Brain MRI wo contrast negative for acute stroke - MRA head no hemodynamically significant stenosis  Subjective: - NAEON - Facial droop unchanged, no new neurologic sx  Objective:  Vitals:   02/21/21 0538 02/21/21 0850  BP: (!) 142/98 113/79  Pulse: 93 93  Resp: 17 18  Temp: (!) 97.4 F (36.3 C) 97.9 F (36.6 C)  SpO2: 99% 98%   Physical Exam Gen: A&O x4, NAD HEENT: Atraumatic, normocephalic;mucous membranes moist; oropharynx clear, tongue without atrophy or fasciculations. Neck: Supple, trachea midline. Resp: CTAB, no w/r/r CV: RRR, no m/g/r; nml S1 and S2. 2+ symmetric peripheral pulses. Abd: soft/NT/ND; nabs x 4 quad Extrem: Nml bulk; no cyanosis, clubbing, or edema.   Neuro: *MS: A&O x4. Follows multi-step commands. *Speech: fluid, nondysarthric, able to name and repeat *CN:   I: Deferred   II,III: PERRLA, VFF by confrontation, optic discs sharp   III,IV,VI: EOMI w/o nystagmus, no ptosis   V: Sensation intact from V1 to V3 to LT   VII: Eyelid closure was full.  Marked L UMN facial droop; forehead elevates symmetrically   VIII: Hearing intact to voice   IX,X: Voice normal, palate elevates symmetrically   XI: SCM/trap 5/5 bilat   XII: Tongue protrudes midline, no atrophy or fasciculations   *Motor:   Normal bulk.  No tremor, rigidity or bradykinesia. No pronator drift.     Strength: Dlt Bic Tri WrE WrF FgS Gr HF KnF KnE PlF DoF    Left 5 5 5 5 5 5 5 5 5 5 5 5     Right 5 5 5 5 5 5 5 5 5 5 5 5       *Sensory: Intact to light touch, pinprick, temperature vibration throughout. Symmetric. Propioception intact bilat.  No double-simultaneous extinction. *Coordination:  Finger-to-nose, heel-to-shin, rapid alternating motions  were intact. *Reflexes:  2+ and symmetric throughout without clonus; toes down-going bilat *Gait: normal base, normal stride, normal turn. Negative Romberg.   NIHSS = 2 marked facial palsy     Premorbid mRS = 1   A/P: 65 yo woman presents to ED with acute onset L facial droop without other focal neuro deficits. Given the fluctuating LUE numbness over the past week (now resolved) and symmetric forehead elevation, favored possible small stroke but noncontrast MRI was negative for restricted diffusion. Given persistence of deficit will order additional MRI contrast sequences to make sure we are not missing alternative central etiology.  - Goal normotension given no stroke on MRI; avoid hypotension - F/u carotid - F/u TTE - Continue ASA 81mg  daily - Start atorvastatin 40mg  daily for LDL 117 - MRI brain T1 + contrast - Will continue to follow  66, MD [facility] [phone]  If 7pm- 7am, please page neurology on call as listed in AMION.

## 2021-02-21 NOTE — Progress Notes (Signed)
PT Cancellation Note  Patient Details Name: April Lindsey MRN: [id number] DOB: Aug 30, 1956   Cancelled Treatment:    Reason Eval/Treat Not Completed: Patient at procedure or test/unavailable.  PT chart reviewed and attempted to be seen.  Pt currently out of room with nurse stating pt is having US performed at this time.  Will be evaluated at later date/time as medically necessary.   Nolon Bussing, PT, DPT 02/21/21, 11:23 AM

## 2021-02-21 NOTE — Progress Notes (Signed)
SLP Cancellation Note  Patient Details Name: April Lindsey MRN: [id number] DOB: 10/30/1955   Cancelled treatment:       Reason Eval/Treat Not Completed: SLP screened, no needs identified, will sign off  Mat Stuard B. Dreama Saa M.S., CCC-SLP, [facility] Speech-Language Pathologist Rehabilitation Services Office [phone]  April Lindsey 02/21/2021, 9:28 AM

## 2021-02-21 NOTE — Evaluation (Signed)
Occupational Therapy Evaluation Patient Details Name: April Lindsey MRN: [id number] DOB: October 10, 1955 Today's Date: 02/21/2021    History of Present Illness 65 y.o. female with PMH significant for  has a past medical history of Anxiety, Arthritis, Fibromyalgia, and Herniated lumbar intervertebral disc. who presents with acute onset isolated L facial droop LKW 1700 today. She has had intermittent numbness of LUE over past week but that resolved. CT head negative for acute infarct, additional imaging pending.   Clinical Impression   Pt seen for OT evaluation this date. Prior to hospital admission, pt was independent in all aspects of ADL/IADL. Pt lives with her spouse in a 1.5 story home, bed/bath on main floor. Currently pt reporting facial droop still present but improving, endorses bilateral tingling in palms of hands (pt attributes to feeling tense from chronic back pain), and mild L eye blurriness resulting from being recently unable to fully close her eyelid (endorses improvement). No visual deficits, coordination deficits, focal weakness, cognitive, speech, or balance deficits appreciated. Pt able to perform LB dressing, grooming at sink, and drinking/eating at baseline independence. No additional skilled OT needs at this time. Please re-consult if additional acute OT needs arise.    Follow Up Recommendations  No OT follow up    Equipment Recommendations  None recommended by OT    Recommendations for Other Services       Precautions / Restrictions Precautions Precautions: None Restrictions Weight Bearing Restrictions: No      Mobility Bed Mobility Overal bed mobility: Independent                  Transfers Overall transfer level: Independent                    Balance Overall balance assessment: No apparent balance deficits (not formally assessed)                                         ADL either performed or assessed with clinical  judgement   ADL Overall ADL's : Independent;At baseline                                       General ADL Comments: No difficulty with dressing, grooming at sink, eating, drinking; pt does have mild facial droop but pt/spouse report improving     Vision Baseline Vision/History: Wears glasses Wears Glasses: Reading only Patient Visual Report: No change from baseline Vision Assessment?: No apparent visual deficits Additional Comments: No deficits with testing, pt does endorse a little blurriness in L eye but reports difficulty blinking fully yesterday/overnight but improving now     Perception     Praxis      Pertinent Vitals/Pain Pain Assessment: Faces Faces Pain Scale: Hurts even more Pain Location: chronic low back pain Pain Descriptors / Indicators: Aching;Grimacing Pain Intervention(s): Limited activity within patient's tolerance;Monitored during session;Repositioned;Ice applied     Hand Dominance Right   Extremity/Trunk Assessment Upper Extremity Assessment Upper Extremity Assessment: Overall WFL for tasks assessed (pt endorses mild "tingling" in both hands equally)   Lower Extremity Assessment Lower Extremity Assessment: Overall WFL for tasks assessed   Cervical / Trunk Assessment Cervical / Trunk Assessment: Normal   Communication Communication Communication: No difficulties   Cognition Arousal/Alertness: Awake/alert Behavior During Therapy: WFL for tasks assessed/performed Overall  Cognitive Status: Within Functional Limits for tasks assessed                                     General Comments       Exercises Other Exercises Other Exercises: Pt/spouse educated in s/s stroke   Shoulder Instructions      Home Living Family/patient expects to be discharged to:: Private residence Living Arrangements: Spouse/significant other Available Help at Discharge: Family Type of Home: House       Home Layout:  Multi-level Alternate Level Stairs-Number of Steps: bed/bath on main, partial floor upstairs             Home Equipment: None          Prior Functioning/Environment Level of Independence: Independent                 OT Problem List: Impaired sensation      OT Treatment/Interventions:      OT Goals(Current goals can be found in the care plan section) Acute Rehab OT Goals Patient Stated Goal: go home OT Goal Formulation: All assessment and education complete, DC therapy  OT Frequency:     Barriers to D/C:            Co-evaluation              AM-PAC OT "6 Clicks" Daily Activity     Outcome Measure Help from another person eating meals?: None Help from another person taking care of personal grooming?: None Help from another person toileting, which includes using toliet, bedpan, or urinal?: None Help from another person bathing (including washing, rinsing, drying)?: None Help from another person to put on and taking off regular upper body clothing?: None Help from another person to put on and taking off regular lower body clothing?: None 6 Click Score: 24   End of Session    Activity Tolerance: Patient tolerated treatment well Patient left: in bed;with call bell/phone within reach;with family/visitor present  OT Visit Diagnosis: Other abnormalities of gait and mobility (R26.89)                Time: 1194-1740 OT Time Calculation (min): 16 min Charges:  OT General Charges $OT Visit: 1 Visit OT Evaluation $OT Eval Low Complexity: 1 Low  Wynona Canes, MPH, MS, OTR/L ascom [phone] 02/21/21, 9:30 AM

## 2021-02-21 NOTE — Progress Notes (Signed)
PROGRESS NOTE    April Lindsey  NWG:[id number] DOB: 01-04-1956 DOA: 02/20/2021 PCP: Patrice Paradise, MD    Chief Complaint  Patient presents with   Code Stroke    Brief Narrative: Patient is a pleasant 65 year old female history of anxiety, fibromyalgia, arthritis, herniated lumbar intervertebral disc presenting with acute onset isolated left facial droop and some intermittent numbness of left upper extremity over the past week that seem to have resolved.  On day of admission patient was eating when she noted food dripping out of her mouth with a left facial droop and presented to the ED for evaluation.  Head CT done negative.  Patient seen in consultation by neurology who recommended admission for stroke work-up.   Assessment & Plan:   Active Problems:   Facial droop   Fibromyalgia   Mixed anxiety and depressive disorder   Prediabetes   Leukocytosis   Hypokalemia   History of COVID-19   1 left facial droop likely secondary to TIA versus CVA. -Patient seen in consultation by neurology as patient had sudden onset left facial droop and had intermittent numbness of the left upper extremity x1 week which had seemed to have resolved. -Doubt if the Bell's palsy as forehead elevates symmetrically per neurology. -tPA not given due to low NIHSS. -Head CT done negative for acute abnormalities -2D echo pending -MRI head MRA head pending. -Carotid ultrasound pending. -Fasting lipid panel with LDL of 112. -Hemoglobin A1c of 5.5. -Permissive hypertension x48 hours from symptom onset to keep systolic blood pressure less than 220. -Continue aspirin 81 mg daily for secondary stroke prophylaxis. -Place on Lipitor 40 mg daily. -PT/OT/SLP -Neurology following and appreciate input and recommendations.  2.  Fibromyalgia -Patient with recent flare. -Patient with complaints of acute fibromyalgia flare with complaints of bilateral hip pain, bilateral thigh pain, bilateral lower  extremity pain. -Continue prednisone 20 mg daily. -Increase hydrocodone to 1 to 2 tablets every 4 hours as needed pain. -IV Toradol 30 mg every 6 hours as needed.  3.  History of COVID-19 -Patient noted to have positive COVID-19 3 to 4 weeks ago and remained asymptomatic. -Repeat COVID-19 PCR negative. -Currently on prednisone 20 mg daily for URI and concern for adrenal insufficiency for abrupt discontinuation.  4.  Leukocytosis -Likely steroid-induced. -No signs or symptoms of infection. -Follow.  5.  Hypokalemia -Repleted.    DVT prophylaxis: Lovenox Code Status: Full Family Communication: Updated patient and husband at bedside. Disposition:   Status is: Observation  The patient remains OBS appropriate and will d/c before 2 midnights.  Dispo: The patient is from: Home              Anticipated d/c is to: Home              Patient currently is not medically stable to d/c.   Difficult to place patient No       Consultants:  Neurology: Dr. Russella Dar  Procedures:  Carotid ultrasound pending 02/21/2021 MRI MRA head pending 02/21/2021 2D echo 02/21/2021 CT head 02/20/2021  Antimicrobials:  None   Subjective: Just returned from carotid ultrasound and MRI head.  Complaining of pain from fibromyalgia in hips, thighs bilaterally and lower legs bilaterally.  States current pain regimen not helping and would like something stronger.  Denies any chest pain.  No shortness of breath.  Husband at bedside.  Objective: Vitals:   02/21/21 0005 02/21/21 0136 02/21/21 0538 02/21/21 0850  BP:  (!) 145/85 (!) 142/98 113/79  Pulse:  99  93 93  Resp:  18 17 18   Temp:  97.9 F (36.6 C) (!) 97.4 F (36.3 C) 97.9 F (36.6 C)  TempSrc: Oral Oral    SpO2:  100% 99% 98%  Weight: 73.5 kg     Height: 5\' 2"  (1.575 m)       Intake/Output Summary (Last 24 hours) at 02/21/2021 1229 Last data filed at 02/21/2021 1037 Gross per 24 hour  Intake 675.31 ml  Output --  Net 675.31 ml   Filed  Weights   02/20/21 1901 02/21/21 0005  Weight: 73.3 kg 73.5 kg    Examination:  General exam: Appears calm and comfortable  Respiratory system: Clear to auscultation. Respiratory effort normal. Cardiovascular system: S1 & S2 heard, RRR. No JVD, murmurs, rubs, gallops or clicks. No pedal edema. Gastrointestinal system: Abdomen is nondistended, soft and nontender. No organomegaly or masses felt. Normal bowel sounds heard. Central nervous system: Alert with slight left facial droop.  No other focal neurological deficits.  Extremities: Symmetric 5 x 5 power. Skin: No rashes, lesions or ulcers Psychiatry: Judgement and insight appear normal. Mood & affect appropriate.     Data Reviewed: I have personally reviewed following labs and imaging studies  CBC: Recent Labs  Lab 02/20/21 1903 02/21/21 0445  WBC 12.5* 11.7*  NEUTROABS 7.5  --   HGB 14.5 13.4  HCT 42.6 39.2  MCV 87.3 87.5  PLT 272 242    Basic Metabolic Panel: Recent Labs  Lab 02/20/21 1903 02/21/21 0445  NA 140 141  K 3.2* 3.8  CL 104 107  CO2 28 27  GLUCOSE 123* 99  BUN 18 15  CREATININE 0.83 0.58  CALCIUM 9.4 8.6*    GFR: Estimated Creatinine Clearance: 67.6 mL/min (by C-G formula based on SCr of 0.58 mg/dL).  Liver Function Tests: Recent Labs  Lab 02/20/21 1903  AST 22  ALT 20  ALKPHOS 60  BILITOT 0.7  PROT 7.2  ALBUMIN 4.1    CBG: Recent Labs  Lab 02/20/21 1849 02/21/21 0740  GLUCAP 138* 122*     Recent Results (from the past 240 hour(s))  Resp Panel by RT-PCR (Flu A&B, Covid) Nasopharyngeal Swab     Status: None   Collection Time: 02/20/21  7:38 PM   Specimen: Nasopharyngeal Swab; Nasopharyngeal(NP) swabs in vial transport medium  Result Value Ref Range Status   SARS Coronavirus 2 by RT PCR NEGATIVE NEGATIVE Final    Comment: (NOTE) SARS-CoV-2 target nucleic acids are NOT DETECTED.  The SARS-CoV-2 RNA is generally detectable in upper respiratory specimens during the acute phase  of infection. The lowest concentration of SARS-CoV-2 viral copies this assay can detect is 138 copies/mL. A negative result does not preclude SARS-Cov-2 infection and should not be used as the sole basis for treatment or other patient management decisions. A negative result may occur with  improper specimen collection/handling, submission of specimen other than nasopharyngeal swab, presence of viral mutation(s) within the areas targeted by this assay, and inadequate number of viral copies(<138 copies/mL). A negative result must be combined with clinical observations, patient history, and epidemiological information. The expected result is Negative.  Fact Sheet for Patients:  02/23/21  Fact Sheet for Healthcare Providers:  02/22/21  This test is no t yet approved or cleared by the [employer] FDA and  has been authorized for detection and/or diagnosis of SARS-CoV-2 by FDA under an Emergency Use Authorization (EUA). This EUA will remain  in effect (meaning this test can be used) for  the duration of the COVID-19 declaration under Section 564(b)(1) of the Act, 21 U.S.C.section 360bbb-3(b)(1), unless the authorization is terminated  or revoked sooner.       Influenza A by PCR NEGATIVE NEGATIVE Final   Influenza B by PCR NEGATIVE NEGATIVE Final    Comment: (NOTE) The Xpert Xpress SARS-CoV-2/FLU/RSV plus assay is intended as an aid in the diagnosis of influenza from Nasopharyngeal swab specimens and should not be used as a sole basis for treatment. Nasal washings and aspirates are unacceptable for Xpert Xpress SARS-CoV-2/FLU/RSV testing.  Fact Sheet for Patients: [employer]  Fact Sheet for Healthcare Providers: [employer]  This test is not yet approved or cleared by the [place] FDA and has been authorized for detection and/or diagnosis of  SARS-CoV-2 by FDA under an Emergency Use Authorization (EUA). This EUA will remain in effect (meaning this test can be used) for the duration of the COVID-19 declaration under Section 564(b)(1) of the Act, 21 U.S.C. section 360bbb-3(b)(1), unless the authorization is terminated or revoked.  Performed at [facility], [address] [place], [place] [address]          Radiology Studies: US Carotid Bilateral (at [facility] and [facility] only)  Result Date: 02/21/2021 CLINICAL DATA:  65 year old female with history of facial droop EXAM: BILATERAL CAROTID DUPLEX ULTRASOUND TECHNIQUE: Wallace Cullens scale imaging, color Doppler and duplex ultrasound were performed of bilateral carotid and vertebral arteries in the neck. COMPARISON:  None. FINDINGS: Criteria: Quantification of carotid stenosis is based on velocity parameters that correlate the residual internal carotid diameter with NASCET-based stenosis levels, using the diameter of the distal internal carotid lumen as the denominator for stenosis measurement. The following velocity measurements were obtained: RIGHT ICA:  Systolic 66 cm/sec, Diastolic 25 cm/sec CCA:  64 cm/sec SYSTOLIC ICA/CCA RATIO:  1.0 ECA:  63 cm/sec LEFT ICA:  Systolic 62 cm/sec, Diastolic 14 cm/sec CCA:  62 cm/sec SYSTOLIC ICA/CCA RATIO:  1.0 ECA:  67 cm/sec Right Brachial SBP: Not acquired Left Brachial SBP: Not acquired RIGHT CAROTID ARTERY: No significant calcified disease of the right common carotid artery. Intermediate waveform maintained. Homogeneous plaque without significant calcifications at the right carotid bifurcation. Low resistance waveform of the right ICA. No significant tortuosity. RIGHT VERTEBRAL ARTERY: Antegrade flow with low resistance waveform. LEFT CAROTID ARTERY: No significant calcified disease of the left common carotid artery. Intermediate waveform maintained. Homogeneous plaque at the left carotid bifurcation without significant calcifications. Low resistance  waveform of the left ICA. LEFT VERTEBRAL ARTERY:  Antegrade flow with low resistance waveform. IMPRESSION: Color duplex indicates minimal homogeneous plaque, with no hemodynamically significant stenosis by duplex criteria in the extracranial cerebrovascular circulation. Signed, Yvone Neu. Reyne Dumas, RPVI [facility] Electronically Signed   By: Gilmer Mor D.O.   On: 02/21/2021 12:00   CT HEAD CODE STROKE WO CONTRAST  Result Date: 02/20/2021 CLINICAL DATA:  Code stroke. Blurred vision left eye. Left-sided facial droop. EXAM: CT HEAD WITHOUT CONTRAST TECHNIQUE: Contiguous axial images were obtained from the base of the skull through the vertex without intravenous contrast. COMPARISON:  None. FINDINGS: Brain: No evidence of acute large vascular territory infarction, hemorrhage, hydrocephalus, extra-axial collection or mass lesion/mass effect. Vascular: No hyperdense vessel identified. Skull: No acute fracture. Sinuses/Orbits: Visualized sinuses are clear.  Unremarkable orbits. Other: No mastoid effusions. ASPECTS [facility] Stroke Program Early CT Score) Total score (0-10 with 10 being normal): 10. IMPRESSION: No evidence of acute intracranial abnormality. ASPECTS is 10. Findings discussed  with Dr. Fanny BienQuale at 7:08 p.m. via telephone. Electronically Signed   By: Feliberto HartsFrederick S Jones MD   On: 02/20/2021 19:12        Scheduled Meds:  aspirin EC  81 mg Oral Daily   citalopram  20 mg Oral Daily   enoxaparin (LOVENOX) injection  40 mg Subcutaneous Q24H   polyethylene glycol  17 g Oral BID   predniSONE  20 mg Oral Q breakfast   senna-docusate  1 tablet Oral QHS   Continuous Infusions:  sodium chloride 125 mL/hr at 02/21/21 0344     LOS: 0 days    Time spent: 35 minutes    Ramiro Harvestaniel Cloe Sockwell, MD [facility]   To contact the attending provider between 7A-7P or the covering provider during after hours 7P-7A, please log into the web site  [email or web address] and access using universal [place] password for that web site. If you do not have the password, please call the hospital operator.  02/21/2021, 12:29 PM

## 2021-02-21 NOTE — Progress Notes (Signed)
*  PRELIMINARY RESULTS* Echocardiogram 2D Echocardiogram has been performed.  Cristela Blue 02/21/2021, 10:00 AM

## 2021-02-21 NOTE — Evaluation (Signed)
Physical Therapy Evaluation Patient Details Name: April Lindsey MRN: [id number] DOB: [birth year] Today's Date: 02/21/2021   History of Present Illness  65 y.o. female with PMH significant for  has a past medical history of Anxiety, Arthritis, Fibromyalgia, and Herniated lumbar intervertebral disc. who presents with acute onset isolated L facial droop LKW 1700 today. She has had intermittent numbness of LUE over past week but that resolved. CT head negative for acute infarct, additional imaging pending.    Clinical Impression  Pt received in Semi-Fowler's position and agreeable to therapy.  Pt able to perform bed mobility independently and able to ambulate in room and down to therapy gym with no complication.  Pt then performed stair training with no issues and returned to room with husband waiting.  Pt feels as though her pain is much more managed at this time and she is at baseline levels other than weakness from not being able to sleep for the past several weeks.  Patient is at baseline, all education completed, and time is given to address all questions/concerns. No additional skilled PT services needed at this time, PT signing off. PT recommends daily ambulation ad lib or with nursing staff as needed to prevent deconditioning.      Follow Up Recommendations No PT follow up    Equipment Recommendations  None recommended by PT    Recommendations for Other Services       Precautions / Restrictions Precautions Precautions: None Restrictions Weight Bearing Restrictions: No      Mobility  Bed Mobility Overal bed mobility: Independent                  Transfers Overall transfer level: Independent                  Ambulation/Gait Ambulation/Gait assistance: Independent Gait Distance (Feet): 320 Feet Assistive device: None Gait Pattern/deviations: WFL(Within Functional Limits) Gait velocity: adequate speed      Stairs Stairs: Yes Stairs assistance:  Independent Stair Management: One rail Right      Wheelchair Mobility    Modified Rankin (Stroke Patients Only)       Balance Overall balance assessment: No apparent balance deficits (not formally assessed)                                           Pertinent Vitals/Pain Pain Assessment: Faces Faces Pain Scale: Hurts even more Pain Location: chronic low back pain Pain Descriptors / Indicators: Aching;Grimacing Pain Intervention(s): Limited activity within patient's tolerance;Monitored during session;Repositioned;Ice applied    Home Living Family/patient expects to be discharged to:: Private residence Living Arrangements: Spouse/significant other Available Help at Discharge: Family Type of Home: House       Home Layout: Multi-level Home Equipment: None      Prior Function Level of Independence: Independent               Hand Dominance   Dominant Hand: Right    Extremity/Trunk Assessment   Upper Extremity Assessment Upper Extremity Assessment: Overall WFL for tasks assessed    Lower Extremity Assessment Lower Extremity Assessment: Overall WFL for tasks assessed    Cervical / Trunk Assessment Cervical / Trunk Assessment: Normal  Communication   Communication: No difficulties  Cognition Arousal/Alertness: Awake/alert Behavior During Therapy: WFL for tasks assessed/performed Overall Cognitive Status: Within Functional Limits for tasks assessed  General Comments      Exercises     Assessment/Plan    PT Assessment Patent does not need any further PT services  PT Problem List         PT Treatment Interventions      PT Goals (Current goals can be found in the Care Plan section)  Acute Rehab PT Goals Patient Stated Goal: go home PT Goal Formulation: With patient/family Time For Goal Achievement: 03/07/21 Potential to Achieve Goals: Good    Frequency     Barriers  to discharge        Co-evaluation               AM-PAC PT "6 Clicks" Mobility  Outcome Measure Help needed turning from your back to your side while in a flat bed without using bedrails?: None Help needed moving from lying on your back to sitting on the side of a flat bed without using bedrails?: None Help needed moving to and from a bed to a chair (including a wheelchair)?: None Help needed standing up from a chair using your arms (e.g., wheelchair or bedside chair)?: None Help needed to walk in hospital room?: None Help needed climbing 3-5 steps with a railing? : None 6 Click Score: 24    End of Session Equipment Utilized During Treatment: Gait belt Activity Tolerance: Patient tolerated treatment well Patient left: in bed;with call bell/phone within reach;with family/visitor present Nurse Communication: Mobility status      Time: 0623-7628 PT Time Calculation (min) (ACUTE ONLY): 20 min   Charges:   PT Evaluation $PT Eval Low Complexity: 1 Low PT Treatments $Gait Training: 8-22 mins        Nolon Bussing, PT, DPT 02/21/21, 4:08 PM   Phineas Real 02/21/2021, 3:52 PM

## 2021-02-22 ENCOUNTER — Observation Stay: Payer: BLUE CROSS/BLUE SHIELD

## 2021-02-22 DIAGNOSIS — E876 Hypokalemia: Secondary | ICD-10-CM | POA: Diagnosis not present

## 2021-02-22 DIAGNOSIS — I10 Essential (primary) hypertension: Secondary | ICD-10-CM | POA: Diagnosis not present

## 2021-02-22 DIAGNOSIS — M797 Fibromyalgia: Secondary | ICD-10-CM | POA: Diagnosis not present

## 2021-02-22 DIAGNOSIS — Z8616 Personal history of COVID-19: Secondary | ICD-10-CM | POA: Diagnosis not present

## 2021-02-22 LAB — BASIC METABOLIC PANEL
Anion gap: 6 (ref 5–15)
BUN: 10 mg/dL (ref 8–23)
CO2: 24 mmol/L (ref 22–32)
Calcium: 8.7 mg/dL — ABNORMAL LOW (ref 8.9–10.3)
Chloride: 108 mmol/L (ref 98–111)
Creatinine, Ser: 0.46 mg/dL (ref 0.44–1.00)
GFR, Estimated: >60 mL/min (ref >60)
Glucose, Bld: 107 mg/dL — ABNORMAL HIGH (ref 70–99)
Potassium: 3.5 mmol/L (ref 3.5–5.1)
Sodium: 138 mmol/L (ref 135–145)

## 2021-02-22 IMAGING — MR MR HEAD W/ CM
3 series · 48 of 48 positions shown · IV contrast (7.5ml Gadavist)
Comparison: MRI [DATE].

CLINICAL DATA: Facial droop.

EXAM:
MRI HEAD WITH CONTRAST
TECHNIQUE: Multiplanar, multiecho pulse sequences of the brain and surrounding
structures were obtained with intravenous contrast.
CONTRAST:  7.5mL GADAVIST GADOBUTROL 1 MMOL/ML IV SOLN

[Series 5: T1 · axial · 1.0mm · 0.98mm/px · z∈[-138,+21]mm · 22 of 160 slices shown]
[im 1/160]
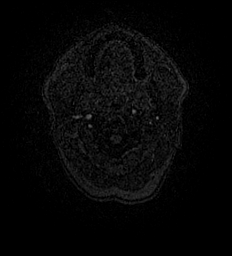
[im 8/160]
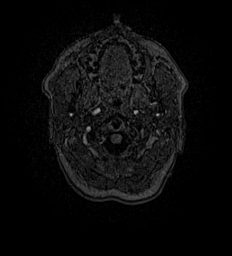
[im 16/160]
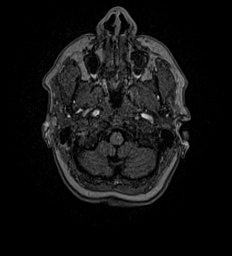
[im 23/160]
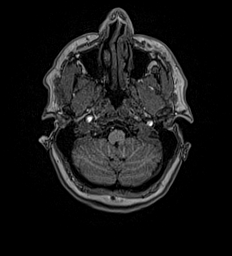
[im 31/160]
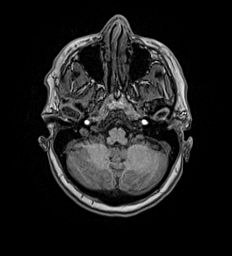
[im 38/160]
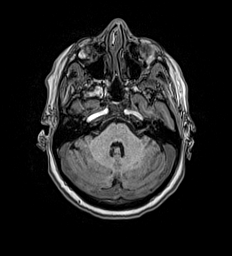
[im 46/160]
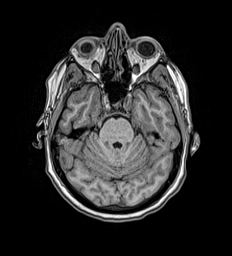
[im 54/160]
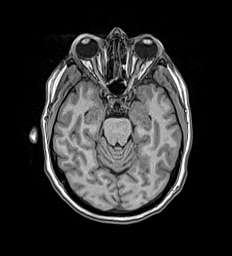
[im 61/160]
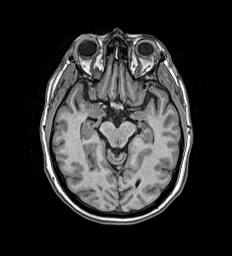
[im 69/160]
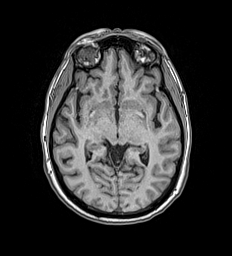
[im 76/160]
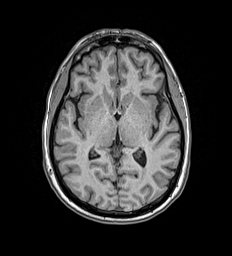
[im 84/160]
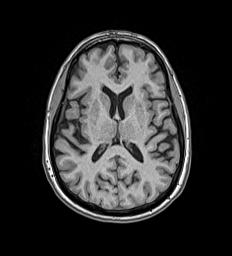
[im 91/160]
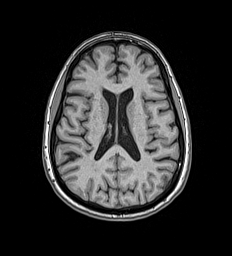
[im 99/160]
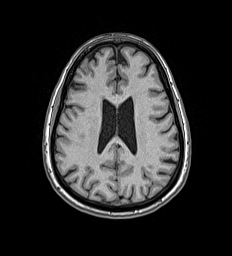
[im 107/160]
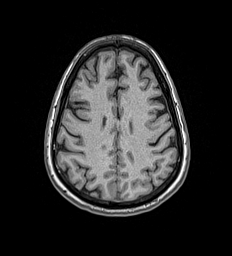
[im 114/160]
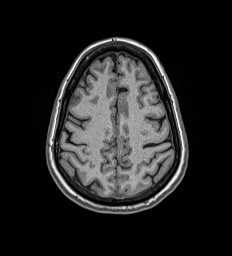
[im 122/160]
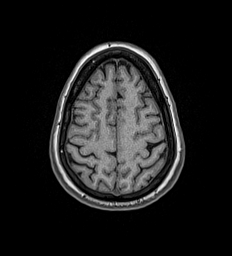
[im 129/160]
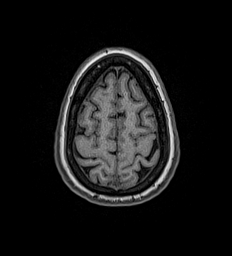
[im 137/160]
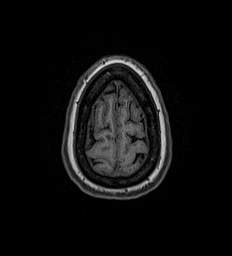
[im 144/160]
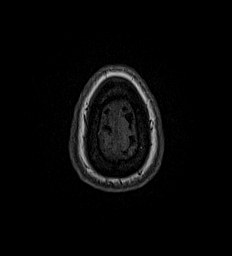
[im 152/160]
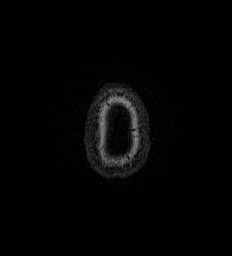
[im 160/160]
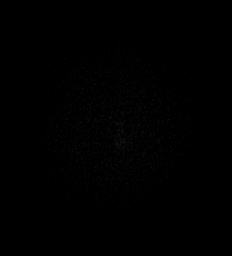

[Series 6: T1 post-contrast · axial · 1.0mm · 0.98mm/px · z∈[-138,+21]mm · 22 of 160 slices shown (1 of 2)]
[im 1/160]
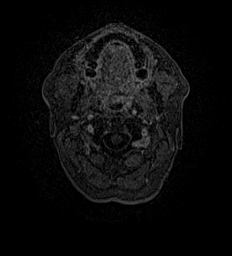
[im 8/160]
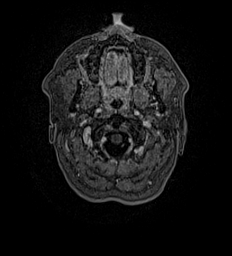
[im 16/160]
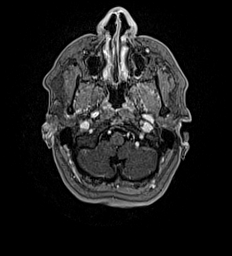
[im 23/160]
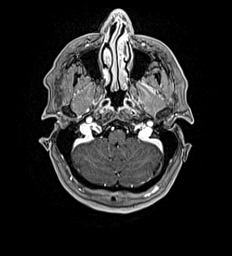
[im 31/160]
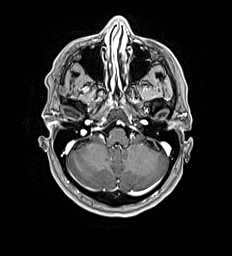
[im 38/160]
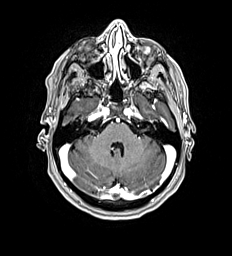
[im 46/160]
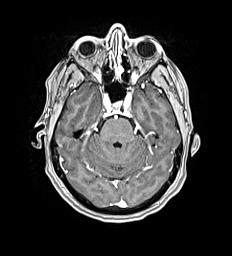
[im 54/160]
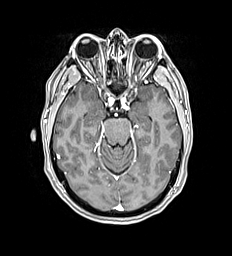
[im 61/160]
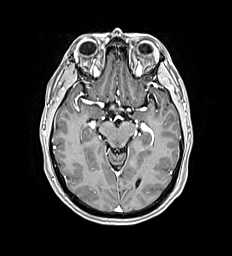
[im 69/160]
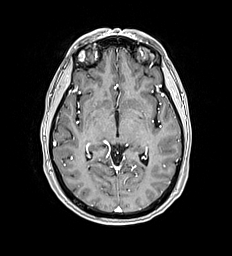
[im 76/160]
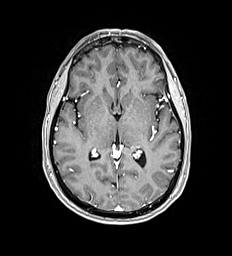
[im 84/160]
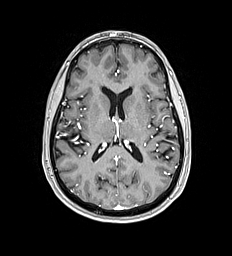
[im 91/160]
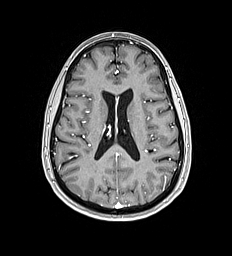
[im 99/160]
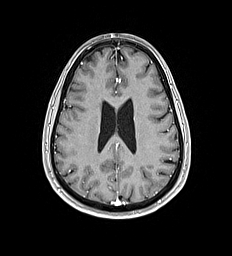
[im 107/160]
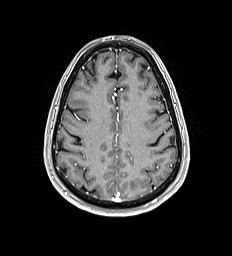
[im 114/160]
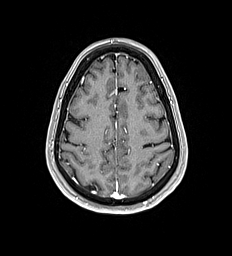
[im 122/160]
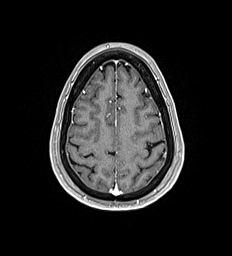
[im 129/160]
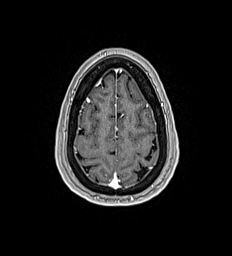
[im 137/160]
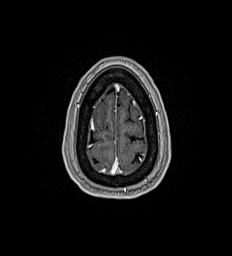
[im 144/160]
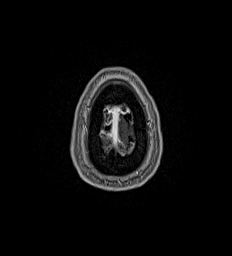
[im 152/160]
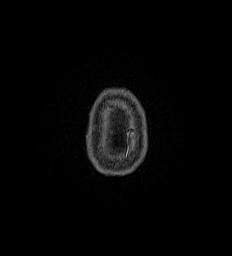
[im 160/160]
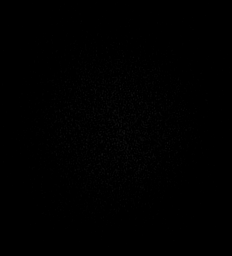

[Series 7: T1 post-contrast · coronal · 5.0mm · 0.57mm/px · 4 of 29 slices shown (2 of 2)]
[im 1/29]
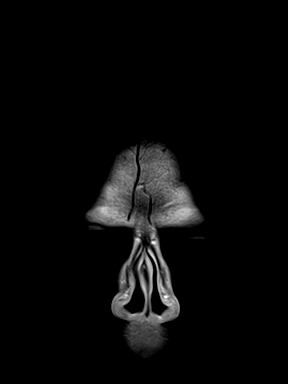
[im 10/29]
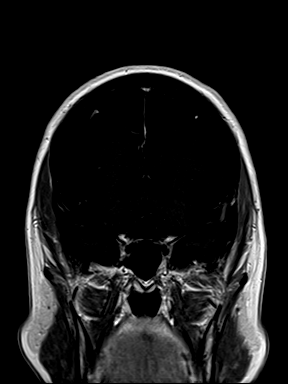
[im 19/29]
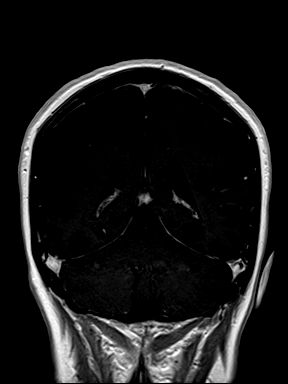
[im 29/29]
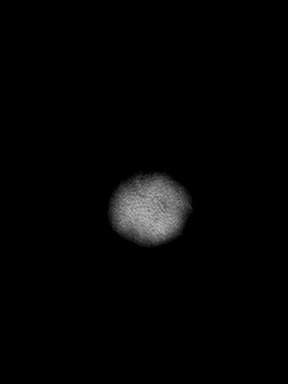

[48 of 48 positions shown; findings below may reference images not displayed]

FINDINGS: Only T1 pre contrast and T1 postcontrast imaging was performed at
the request of the ordering provider.

No evidence of abnormal enhancement. No obvious asymmetric
enhancement of the facial nerves on this standard whole brain
postcontrast imaging (IAC protocol was not performed).

Please see complete MRI without contrast study from yesterday for
further intracranial evaluation.
IMPRESSION: No evidence of abnormal enhancement on this limited postcontrast
study (see above).

## 2021-02-22 MED ORDER — POLYETHYLENE GLYCOL 3350 17 G PO PACK: 17.0000 g | PACK | Freq: Two times a day (BID) | ORAL | 0 refills | Status: AC

## 2021-02-22 MED ORDER — AMLODIPINE BESYLATE 5 MG PO TABS
5.0000 mg | ORAL_TABLET | Freq: Every day | ORAL | Status: DC
  Administered 2021-02-22: 5 mg via ORAL
  Filled 2021-02-22: qty 1

## 2021-02-22 MED ORDER — POTASSIUM CHLORIDE CRYS ER 20 MEQ PO TBCR
40.0000 meq | EXTENDED_RELEASE_TABLET | Freq: Once | ORAL | Status: AC
  Administered 2021-02-22: 40 meq via ORAL
  Filled 2021-02-22: qty 2

## 2021-02-22 MED ORDER — AMLODIPINE BESYLATE 5 MG PO TABS: 5.0000 mg | ORAL_TABLET | Freq: Every day | ORAL | 1 refills | Status: AC

## 2021-02-22 MED ORDER — ASPIRIN 81 MG PO TBEC: 81.0000 mg | DELAYED_RELEASE_TABLET | Freq: Every day | ORAL | 11 refills | Status: AC

## 2021-02-22 MED ORDER — CITALOPRAM HYDROBROMIDE 10 MG PO TABS: 20.0000 mg | ORAL_TABLET | Freq: Every day | ORAL | Status: AC

## 2021-02-22 MED ORDER — PREDNISONE 20 MG PO TABS: 20.0000 mg | ORAL_TABLET | Freq: Every day | ORAL | Status: AC

## 2021-02-22 MED ORDER — ZOLPIDEM TARTRATE 5 MG PO TABS: 5.0000 mg | ORAL_TABLET | Freq: Every evening | ORAL | Status: DC | PRN

## 2021-02-22 MED ORDER — DICLOFENAC SODIUM 1 % EX GEL: 2.0000 g | Freq: Four times a day (QID) | CUTANEOUS | 0 refills | Status: AC | PRN

## 2021-02-22 MED ORDER — SENNOSIDES-DOCUSATE SODIUM 8.6-50 MG PO TABS: 1.0000 | ORAL_TABLET | Freq: Every day | ORAL | Status: AC

## 2021-02-22 MED ORDER — GADOBUTROL 1 MMOL/ML IV SOLN
7.5000 mL | Freq: Once | INTRAVENOUS | Status: AC | PRN
  Administered 2021-02-22: 7.5 mL via INTRAVENOUS

## 2021-02-22 NOTE — Progress Notes (Signed)
Transport arrived to take pt for MRI. However pt is very anxious and restless, and refused to go at this time. PRN Ativan given. Will re-evaluate to see if medication helps and patient can then go down for MRI.

## 2021-02-22 NOTE — Progress Notes (Signed)
Update:  Pt initially felt relief and fell asleep after Ativan given, however she woke up about 5 minutes after I left the room. She is very restless even though you can clearly tell she fighting the medication. Once settled in bed after pacing, she will fall back asleep but then is immediately up 3-5 mins later and wanting to walk around the room.  No complaints of pain at this time. Will continue to monitor patient.

## 2021-02-22 NOTE — Progress Notes (Signed)
Patient and husband verbalized understanding of all discharge instructions including follow up appointments.

## 2021-02-22 NOTE — Progress Notes (Signed)
Neurology Progress Note   Patient ID: 65 yo woman with hx fibromyalgia who presented as stroke code for acute onset L isolated L facial droop. She also reported intermittent LUE numbness approx 1 week ago that has since resolved.  Interval data: - Brain MRI wo contrast negative for acute stroke - Brain MRI w contrast showed no abnl enhancement - TTE showed no intracardiac clot or sig abnl other than grade I diastolic dysfunction - MRA head no hemodynamically significant stenosis - Carotid US no hemodynamically significant stenosis  Subjective: - NAEON - Facial droop unchanged, no new neurologic sx  Objective:  Vitals:   02/22/21 0827 02/22/21 1242  BP: (!) 161/94 (!) 158/108  Pulse: 98 99  Resp: 18 16  Temp: 98.3 F (36.8 C) 98.1 F (36.7 C)  SpO2: 98% 96%   Physical Exam Gen: A&O x4, NAD HEENT: Atraumatic, normocephalic;mucous membranes moist; oropharynx clear, tongue without atrophy or fasciculations. Neck: Supple, trachea midline. Resp: CTAB, no w/r/r CV: RRR, no m/g/r; nml S1 and S2. 2+ symmetric peripheral pulses. Abd: soft/NT/ND; nabs x 4 quad Extrem: Nml bulk; no cyanosis, clubbing, or edema.   Neuro: *MS: A&O x4. Follows multi-step commands. *Speech: fluid, nondysarthric, able to name and repeat *CN:   I: Deferred   II,III: PERRLA, VFF by confrontation, optic discs sharp   III,IV,VI: EOMI w/o nystagmus, no ptosis   V: Sensation intact from V1 to V3 to LT   VII: Eyelid closure was full.  Marked L UMN facial droop; forehead elevates symmetrically   VIII: Hearing intact to voice   IX,X: Voice normal, palate elevates symmetrically   XI: SCM/trap 5/5 bilat   XII: Tongue protrudes midline, no atrophy or fasciculations   *Motor:   Normal bulk.  No tremor, rigidity or bradykinesia. No pronator drift.     Strength: Dlt Bic Tri WrE WrF FgS Gr HF KnF KnE PlF DoF    Left 5 5 5 5 5 5 5 5 5 5 5 5     Right 5 5 5 5 5 5 5 5 5 5 5 5       *Sensory: Intact to light  touch, pinprick, temperature vibration throughout. Symmetric. Propioception intact bilat.  No double-simultaneous extinction. *Coordination:  Finger-to-nose, heel-to-shin, rapid alternating motions were intact. *Reflexes:  2+ and symmetric throughout without clonus; toes down-going bilat *Gait: normal base, normal stride, normal turn. Negative Romberg.   NIHSS = 2 marked facial palsy     Premorbid mRS = 1   A/P: 65 yo woman presents to ED with acute onset L facial droop without other focal neuro deficits. Given the fluctuating LUE numbness over the past week (now resolved) and symmetric forehead elevation, favor possible MRI-negative small stroke. Noncontrast MRI was negative for restricted diffusion and contrasted scan showed no abnl enhancement. Presentation would be highly atypical for Bell's given largely symmetric forehead elevation; regardless she is already on prednisone.  - No further inpatient neurologic w/u indicated - Continue ASA 81mg  daily - Started atorvastatin 40mg  daily for LDL 117 but this was d/c'd per patient request 2/2 concern for fibromyalgia exacerbation. It would be unlikely to cause an exacerbation but given pt's concern and current acute flare it is reasonable to defer this to outpatient setting - F/u with established outpatient neurologist Dr.  Neurology to sign off, but please re-engage if additional questions arise  66, MD [facility] [phone]  If 7pm- 7am, please page neurology on call as listed in [facility]ION.

## 2021-02-22 NOTE — Discharge Summary (Signed)
Physician Discharge Summary  April Lindsey:[id number] DOB: 02-Sep-1955 DOA: 02/20/2021  PCP: Patrice Paradise, MD  Admit date: 02/20/2021 Discharge date: 02/22/2021  Time spent: 55 minutes  Recommendations for Outpatient Follow-up:  Follow-up with Patrice Paradise, MD in 1 to 2 weeks.  On follow-up patient's hypertension will need to be reassessed as patient started on Norvasc 5 mg daily.  Patient's fibromyalgia need to be followed up upon.  Follow-up on initiation of statin as patient hesitant to be discharged on a statin due to fibromyalgia flare. Follow-up with Dr. Malvin Johns, neurology in 3 to 4 weeks.   Discharge Diagnoses:  Principal Problem:   Facial droop Active Problems:   Fibromyalgia   Mixed anxiety and depressive disorder   Prediabetes   Leukocytosis   Hypokalemia   History of COVID-19   HTN (hypertension)   Discharge Condition: Stable and improved  Diet recommendation: Heart healthy  Filed Weights   02/20/21 1901 02/21/21 0005  Weight: 73.3 kg 73.5 kg    History of present illness:  HPI per Dr. Barnetta Hammersmith is 65 y.o. female with PMH of fibromyalgia, anxiety/depression, and COVID within the past month who presents for acute onset left facial droop at 1700 on the day of admission. She has had intermittent numbness of LUE over past week but that resolved. She was eating dinner tonight when she noticed food dribbling out of her mouth and L facial droop. Facial droop has persisted. However, does note initially couldn't close left eyelid and now she can. No weakness of extremities. No difficulty with gait. Has no prior h/o TIA/CVA.   She reports that she had COVID in May. Continued to have respiratory symptoms and was prescribed Prednisone 40 mg daily x5 days. When she stopped this medication she began to have severe fibromyalgia symptoms. She was then restarted on prednisone taper, should lower to 20 mg tomorrow. She was also prescribed Hydrocodone  for pain control.      ED work-up/course:    Patient presents with sudden onset left-sided facial weakness for about 1 hour.  This is associated with a feeling of intermittent tingling in the left arm for the last couple days with resolution of that.  On exam she has sparing of the left temporalis which is concerning for central cause though her presentation seems to suggest also a high potential for Bell's palsy.  There is no obvious rash there is no obvious etiology such as HSV, zoster etc. at this point.  Patient was seen and activated as a code stroke due to concerns for an acute central neurologic etiology.  Seen by neurology Dr. Selina Cooley in the ER, she recommends at this point after review of imaging administration of salicylate, and admission to the hospital for further work-up for neurologic deficit etiology is not yet clear, neurology involved, but differential certainly includes possible stroke TIA, Bell's palsy etc.  As far as the patient's recent COVID, she reports resolution of those symptoms she is afebrile no cough.  I suspect she is cleared from her COVID infection at this point but also is on high-dose prednisone now for fibromyalgia flareup.  ----------------------------------------- 8:23 PM on 02/20/2021 ----------------------------------------- Labs this point revealed mild leukocytosis which is likely in this case related to steroid use.  She denies infectious symptoms or fever.  COVID panel pending and but anticipate her COVID test may return positive though I think she is beyond her infectious window.  Mild hypokalemia  Admission discussed with hospitalist.  Hospital Course:  1 left facial droop likely secondary to TIA versus CVA not seen on MRI versus atypical Bell's palsy. -Patient seen in consultation by neurology as patient had sudden onset left facial droop and had intermittent numbness of the left upper extremity x1 week which had seemed to have resolved. -Doubt if the  Bell's palsy as forehead elevates symmetrically per neurology. -tPA not given due to low NIHSS. -Head CT done negative for acute abnormalities -2D echo with EF of 60 to 65%, NWMA, no source of emboli noted. -MRI head MRA head negative for any acute abnormalities, no proximal intracranial vessel occlusion or significant stenosis noted.  -Carotid ultrasound with no significant ICA stenosis.   -Fasting lipid panel with LDL of 112. -Hemoglobin A1c of 5.5. -Patient placed on aspirin 81 mg daily for secondary stroke prophylaxis.   -Patient started on Lipitor 40 mg daily however per neurology patient requested discontinuation of statin secondary to concern for fibromyalgia exacerbation and as such this was discontinued.  -MRI with contrast was negative for any acute abnormalities.   -Patient seen by PT OT and SLP.   -Patient followed by neurology throughout the hospitalization and recommended discharge on aspirin 81 mg daily, with outpatient follow-up with primary neurologist Dr. Malvin JohnsPotter.   -Patient will be discharged in stable and improved condition.    2.  Fibromyalgia -Patient with recent flare. -Patient with complaints of acute fibromyalgia flare with complaints of bilateral hip pain, bilateral thigh pain, bilateral lower extremity pain. -Patient maintained on prednisone 20 mg daily that she was on prior to admission.   -Patient also maintained on hydrocodone as well as placed on IV Toradol as needed for better pain management.   -Outpatient follow-up with PCP.   3.  History of COVID-19 -Patient noted to have positive COVID-19 3 to 4 weeks ago and remained asymptomatic. -Repeat COVID-19 PCR negative. -Patient maintained on prednisone 20 mg daily for URI and concern for adrenal insufficiency for abrupt discontinuation, fibromyalgia flare that she was on previously prior to admission.    4.  Leukocytosis -Likely steroid-induced. -No signs or symptoms of infection. -Outpatient follow-up.  5.   Hypokalemia -Repleted.  6.  Hypertension -Patient started on Norvasc 5 mg daily.  Outpatient follow-up with PCP.      Procedures: Carotid ultrasound 02/21/2021 MRI MRA head 02/21/2021 2D echo 02/21/2021 CT head 02/20/2021 MRI brain with contrast 02/22/2021  Consultations: Neurology: Dr. Russella DarStark  Discharge Exam: Vitals:   02/22/21 1330 02/22/21 1641  BP: (!) 158/92 (!) 167/112  Pulse:  (!) 113  Resp:  17  Temp:  98.7 F (37.1 C)  SpO2:  96%    General: NAD Cardiovascular: Regular rate rhythm no murmurs rubs or gallops.  No JVD.  No lower extremity edema. Respiratory: CTA B.  No wheezes, no crackles, no rhonchi Neuro: Left facial droop otherwise rest of cranial nerves within normal limits.  No other focal neurological deficits.  Discharge Instructions   Discharge Instructions     Diet - low sodium heart healthy   Complete by: As directed    Increase activity slowly   Complete by: As directed       Allergies as of 02/22/2021       Reactions   Codeine Nausea And Vomiting   Morphine And Related Itching   Penicillins Itching   Erythromycin Itching, Rash   Tetracyclines & Related Itching, Rash        Medication List     STOP taking these medications    cyclobenzaprine 10  MG tablet Commonly known as: FLEXERIL       TAKE these medications    amLODipine 5 MG tablet Commonly known as: NORVASC Take 1 tablet (5 mg total) by mouth daily. Start taking on: February 23, 2021   ascorbic acid 1000 MG tablet Commonly known as: VITAMIN C Take 1,000 mg by mouth daily.   aspirin 81 MG EC tablet Take 1 tablet (81 mg total) by mouth daily. Swallow whole. Start taking on: February 23, 2021   BIOFREEZE ROLL-ON EX Apply 1 application topically daily as needed.   cholecalciferol 25 MCG (1000 UNIT) tablet Commonly known as: VITAMIN D3 Take 1,000 Units by mouth daily.   citalopram 10 MG tablet Commonly known as: CeleXA Take 2 tablets (20 mg total) by mouth daily.    co-enzyme Q-10 30 MG capsule Take 30 mg by mouth 3 (three) times daily.   diclofenac Sodium 1 % Gel Commonly known as: VOLTAREN Apply 2 g topically 4 (four) times daily as needed (pain).   Garlic 10 MG Caps Take 10 mg by mouth daily.   HYDROcodone-acetaminophen 5-325 MG tablet Commonly known as: NORCO/VICODIN Take 1 tablet by mouth at bedtime as needed. May take up to every 6 hours as needed for pain if not working or driving   magnesium oxide 400 MG tablet Commonly known as: MAG-OX Take 400 mg by mouth daily.   Multi-Vitamin tablet Take 1 tablet by mouth daily.   Nasacort Allergy 24HR 55 MCG/ACT Aero nasal inhaler Generic drug: triamcinolone Place 2 sprays into the nose daily.   Omega-3 1000 MG Caps Take 1 capsule by mouth daily.   polyethylene glycol 17 g packet Commonly known as: MIRALAX / GLYCOLAX Take 17 g by mouth 2 (two) times daily.   predniSONE 20 MG tablet Commonly known as: DELTASONE Take 1 tablet (20 mg total) by mouth daily. 5 days What changed: how much to take   QC TUMERIC COMPLEX PO Take 1 tablet by mouth daily.   senna-docusate 8.6-50 MG tablet Commonly known as: Senokot-S Take 1 tablet by mouth at bedtime.   zinc gluconate 50 MG tablet Take 50 mg by mouth daily.       Allergies  Allergen Reactions   Codeine Nausea And Vomiting   Morphine And Related Itching   Penicillins Itching   Erythromycin Itching and Rash   Tetracyclines & Related Itching and Rash    Follow-up Information     Patrice Paradise, MD. Schedule an appointment as soon as possible for a visit in 2 week(s).   Specialty: Physician Assistant Why: Follow-up in 1 to 2 weeks. Contact information: [address] [facility] [place] [address] [phone]         Morene Crocker, MD. Schedule an appointment as soon as possible for a visit in 3 week(s).   Specialty: Neurology Why: f/u in 3-4 weeks. Contact information: [address] [facility] [place] [address] [phone]                  The results of significant diagnostics from this hospitalization (including imaging, microbiology, ancillary and laboratory) are listed below for reference.    Significant Diagnostic Studies: MR ANGIO HEAD WO CONTRAST  Result Date: 02/21/2021 CLINICAL DATA:  Facial droop EXAM: MRI HEAD WITHOUT CONTRAST MRA HEAD WITHOUT CONTRAST TECHNIQUE: Multiplanar, multi-echo pulse sequences of the brain and surrounding structures were acquired without intravenous contrast. Angiographic images of the Circle of Willis were acquired using MRA  technique without intravenous contrast. COMPARISON:  No pertinent prior exam. FINDINGS: MRI HEAD Brain: There is no acute infarction or intracranial hemorrhage. There is no intracranial mass, mass effect, or edema. There is no hydrocephalus or extra-axial fluid collection. Ventricles and sulci are within normal limits in size and configuration. A few small foci of T2 hyperintensity in the supratentorial white matter nonspecific but may reflect minor chronic microvascular ischemic changes. Vascular: Major vessel flow voids at the skull base are preserved. Skull and upper cervical spine: Normal marrow signal is preserved. Sinuses/Orbits: Mild mucosal thickening.  Orbits are unremarkable. Other: Sella is unremarkable.  Mastoid air cells are clear. MRA HEAD Intracranial internal carotid arteries are patent. Middle and anterior cerebral arteries are patent. Intracranial vertebral arteries, basilar artery, posterior cerebral arteries are patent. Right posterior communicating artery is present. There is no significant stenosis or aneurysm. IMPRESSION: No evidence of recent infarction, hemorrhage, or mass. No proximal intracranial vessel occlusion or significant stenosis. Electronically Signed   By: Guadlupe Spanish M.D.   On: 02/21/2021 12:31   MR BRAIN WO CONTRAST  Result Date:  02/21/2021 CLINICAL DATA:  Facial droop EXAM: MRI HEAD WITHOUT CONTRAST MRA HEAD WITHOUT CONTRAST TECHNIQUE: Multiplanar, multi-echo pulse sequences of the brain and surrounding structures were acquired without intravenous contrast. Angiographic images of the Circle of Willis were acquired using MRA technique without intravenous contrast. COMPARISON:  No pertinent prior exam. FINDINGS: MRI HEAD Brain: There is no acute infarction or intracranial hemorrhage. There is no intracranial mass, mass effect, or edema. There is no hydrocephalus or extra-axial fluid collection. Ventricles and sulci are within normal limits in size and configuration. A few small foci of T2 hyperintensity in the supratentorial white matter nonspecific but may reflect minor chronic microvascular ischemic changes. Vascular: Major vessel flow voids at the skull base are preserved. Skull and upper cervical spine: Normal marrow signal is preserved. Sinuses/Orbits: Mild mucosal thickening.  Orbits are unremarkable. Other: Sella is unremarkable.  Mastoid air cells are clear. MRA HEAD Intracranial internal carotid arteries are patent. Middle and anterior cerebral arteries are patent. Intracranial vertebral arteries, basilar artery, posterior cerebral arteries are patent. Right posterior communicating artery is present. There is no significant stenosis or aneurysm. IMPRESSION: No evidence of recent infarction, hemorrhage, or mass. No proximal intracranial vessel occlusion or significant stenosis. Electronically Signed   By: Guadlupe Spanish M.D.   On: 02/21/2021 12:31   MR BRAIN W CONTRAST  Result Date: 02/22/2021 CLINICAL DATA:  Facial droop. EXAM: MRI HEAD WITH CONTRAST TECHNIQUE: Multiplanar, multiecho pulse sequences of the brain and surrounding structures were obtained with intravenous contrast. CONTRAST:  7.89mL GADAVIST GADOBUTROL 1 MMOL/ML IV SOLN COMPARISON:  MRI February 21, 2021. FINDINGS: Only T1 pre contrast and T1 postcontrast imaging was  performed at the request of the ordering provider. No evidence of abnormal enhancement. No obvious asymmetric enhancement of the facial nerves on this standard whole brain postcontrast imaging (IAC protocol was not performed). Please see complete MRI without contrast study from yesterday for further intracranial evaluation. IMPRESSION: No evidence of abnormal enhancement on this limited postcontrast study (see above). Electronically Signed   By: Feliberto Harts MD   On: 02/22/2021 12:16   MR CERVICAL SPINE WO CONTRAST  Result Date: 02/12/2021 CLINICAL DATA:  Cervical radiculitis. Degenerative disc disease, cervical. Additional history provided by scanning technologist: Patient reports chronic neck pain and headaches for 10 years. EXAM: MRI CERVICAL SPINE WITHOUT CONTRAST TECHNIQUE: Multiplanar, multisequence MR imaging of the cervical spine was performed. No intravenous  contrast was administered. COMPARISON:  Report from radiographs of the cervical spine 11/02/2020. FINDINGS: Mild intermittent motion degradation. Alignment: Straightening of the expected cervical lordosis. No significant spondylolisthesis. Vertebrae: Vertebral body height is maintained. No significant marrow edema or focal suspicious osseous lesion. Cord: No spinal cord signal abnormality is identified. Posterior Fossa, vertebral arteries, paraspinal tissues: No abnormality identified within included portions of the posterior fossa. Flow voids preserved within the imaged cervical vertebral arteries. Paraspinal soft tissues within normal limits. Disc levels: Mild-to-moderate disc degeneration at C5-C6 and C6-C7. No more than mild disc degeneration at the remaining levels. C2-C3: Tiny central disc protrusion. No significant spinal canal or foraminal stenosis. C3-C4: Mild bilateral uncovertebral hypertrophy. No significant disc herniation or stenosis. C4-C5: Minimal facet arthrosis on the left. No significant disc herniation or stenosis. C5-C6:  Disc bulge with bilateral disc osteophyte ridge/uncinate hypertrophy. Minimal facet arthrosis (predominantly on the left). Minimal effacement of the ventral thecal sac without spinal cord mass effect. Mild/moderate left neural foraminal narrowing. C6-C7: Disc bulge with superimposed tiny central disc protrusion. Mild uncovertebral hypertrophy. Minimal facet arthrosis on the left. Minimal partial effacement of the ventral thecal sac without spinal cord mass effect. Mild right neural foraminal narrowing. C7-T1: Minimal facet arthrosis. No significant disc herniation or stenosis. IMPRESSION: Mildly motion degraded exam. Cervical spondylosis, as outlined. Minimal relative spinal canal narrowing at C5-C6 and C6-C7 without spinal cord mass effect. Neural foraminal narrowing on the left at C5-C6 (mild/moderate) and on the right at C6-C7 (mild). Disc degeneration is greatest at C5-C6 and C6-C7 (mild-to-moderate at these levels). Nonspecific straightening of the expected cervical lordosis. Electronically Signed   By: Jackey Loge DO   On: 02/12/2021 17:32   US Carotid Bilateral (at [facility] and [facility] only)  Result Date: 02/21/2021 CLINICAL DATA:  65 year old female with history of facial droop EXAM: BILATERAL CAROTID DUPLEX ULTRASOUND TECHNIQUE: Wallace Cullens scale imaging, color Doppler and duplex ultrasound were performed of bilateral carotid and vertebral arteries in the neck. COMPARISON:  None. FINDINGS: Criteria: Quantification of carotid stenosis is based on velocity parameters that correlate the residual internal carotid diameter with NASCET-based stenosis levels, using the diameter of the distal internal carotid lumen as the denominator for stenosis measurement. The following velocity measurements were obtained: RIGHT ICA:  Systolic 66 cm/sec, Diastolic 25 cm/sec CCA:  64 cm/sec SYSTOLIC ICA/CCA RATIO:  1.0 ECA:  63 cm/sec LEFT ICA:  Systolic 62 cm/sec, Diastolic 14 cm/sec CCA:  62 cm/sec SYSTOLIC ICA/CCA RATIO:  1.0 ECA:  67  cm/sec Right Brachial SBP: Not acquired Left Brachial SBP: Not acquired RIGHT CAROTID ARTERY: No significant calcified disease of the right common carotid artery. Intermediate waveform maintained. Homogeneous plaque without significant calcifications at the right carotid bifurcation. Low resistance waveform of the right ICA. No significant tortuosity. RIGHT VERTEBRAL ARTERY: Antegrade flow with low resistance waveform. LEFT CAROTID ARTERY: No significant calcified disease of the left common carotid artery. Intermediate waveform maintained. Homogeneous plaque at the left carotid bifurcation without significant calcifications. Low resistance waveform of the left ICA. LEFT VERTEBRAL ARTERY:  Antegrade flow with low resistance waveform. IMPRESSION: Color duplex indicates minimal homogeneous plaque, with no hemodynamically significant stenosis by duplex criteria in the extracranial cerebrovascular circulation. Signed, Yvone Neu. Reyne Dumas, RPVI [facility] Electronically Signed   By: Gilmer Mor D.O.   On: 02/21/2021 12:00   ECHOCARDIOGRAM COMPLETE  Result Date: 02/21/2021    ECHOCARDIOGRAM REPORT   Patient Name:   LONDON NONAKA Date of  Exam: 02/21/2021 Medical Rec #:  [id number]         Height:       62.0 in Accession #:    [id number]        Weight:       160.0 lb Date of Birth:  [birth year]         BSA:          1.739 m Patient Age:    65 years          BP:           142/98 mmHg Patient Gender: F                 HR:           93 bpm. Exam Location:  [facility] Procedure: 2D Echo, Cardiac Doppler and Color Doppler Indications:     Stroke I63.9  History:         Patient has no prior history of Echocardiogram examinations.                  Anxiety.  Sonographer:     Cristela Blue RDCS (AE) Referring Phys:  [id number] Arvilla Market Diagnosing Phys: Julien Nordmann MD  Sonographer Comments: Technically challenging study due to limited acoustic windows,  suboptimal parasternal window and suboptimal apical window. IMPRESSIONS  1. Left ventricular ejection fraction, by estimation, is 60 to 65%. The left ventricle has normal function. The left ventricle has no regional wall motion abnormalities. Left ventricular diastolic parameters are consistent with Grade I diastolic dysfunction (impaired relaxation).  2. Right ventricular systolic function is normal. The right ventricular size is normal. There is normal pulmonary artery systolic pressure. The estimated right ventricular systolic pressure is 14.9 mmHg. FINDINGS  Left Ventricle: Left ventricular ejection fraction, by estimation, is 60 to 65%. The left ventricle has normal function. The left ventricle has no regional wall motion abnormalities. The left ventricular internal cavity size was normal in size. There is  no left ventricular hypertrophy. Left ventricular diastolic parameters are consistent with Grade I diastolic dysfunction (impaired relaxation). Right Ventricle: The right ventricular size is normal. No increase in right ventricular wall thickness. Right ventricular systolic function is normal. There is normal pulmonary artery systolic pressure. The tricuspid regurgitant velocity is 1.57 m/s, and  with an assumed right atrial pressure of 5 mmHg, the estimated right ventricular systolic pressure is 14.9 mmHg. Left Atrium: Left atrial size was normal in size. Right Atrium: Right atrial size was normal in size. Pericardium: There is no evidence of pericardial effusion. Mitral Valve: The mitral valve is normal in structure. No evidence of mitral valve regurgitation. No evidence of mitral valve stenosis. Tricuspid Valve: The tricuspid valve is normal in structure. Tricuspid valve regurgitation is mild . No evidence of tricuspid stenosis. Aortic Valve: The aortic valve is normal in structure. Aortic valve regurgitation is not visualized. Mild to moderate aortic valve sclerosis/calcification is present, without any  evidence of aortic stenosis. Aortic valve mean gradient measures 3.5 mmHg. Aortic valve peak gradient measures 5.8 mmHg. Aortic valve area, by VTI measures 2.67 cm. Pulmonic Valve: The pulmonic valve was normal in structure. Pulmonic valve regurgitation is not visualized. No evidence of pulmonic stenosis. Aorta: The aortic root is normal in size and structure. Venous: The inferior vena cava is normal in size with greater than 50% respiratory variability, suggesting right atrial pressure of 3 mmHg. IAS/Shunts: No atrial level shunt detected by color flow Doppler.  LEFT VENTRICLE PLAX 2D LVIDd:  3.84 cm  Diastology LVIDs:         2.63 cm  LV e' medial:    5.66 cm/s LV PW:         1.19 cm  LV E/e' medial:  11.0 LV IVS:        0.83 cm  LV e' lateral:   6.31 cm/s LVOT diam:     2.00 cm  LV E/e' lateral: 9.8 LV SV:         66 LV SV Index:   38 LVOT Area:     3.14 cm  RIGHT VENTRICLE RV Basal diam:  2.37 cm RV S prime:     9.90 cm/s TAPSE (M-mode): 3.9 cm LEFT ATRIUM         Index LA diam:    3.40 cm 1.96 cm/m  AORTIC VALVE                   PULMONIC VALVE AV Area (Vmax):    2.37 cm    PV Vmax:        0.79 m/s AV Area (Vmean):   2.40 cm    PV Peak grad:   2.5 mmHg AV Area (VTI):     2.67 cm    RVOT Peak grad: 3 mmHg AV Vmax:           120.00 cm/s AV Vmean:          85.850 cm/s AV VTI:            0.248 m AV Peak Grad:      5.8 mmHg AV Mean Grad:      3.5 mmHg LVOT Vmax:         90.70 cm/s LVOT Vmean:        65.500 cm/s LVOT VTI:          0.211 m LVOT/AV VTI ratio: 0.85  AORTA Ao Root diam: 2.80 cm MITRAL VALVE               TRICUSPID VALVE MV Area (PHT): 5.84 cm    TR Peak grad:   9.9 mmHg MV Decel Time: 130 msec    TR Vmax:        157.00 cm/s MV E velocity: 62.10 cm/s MV A velocity: 87.80 cm/s  SHUNTS MV E/A ratio:  0.71        Systemic VTI:  0.21 m                            Systemic Diam: 2.00 cm Julien Nordmann MD Electronically signed by Julien Nordmann MD Signature Date/Time: 02/21/2021/5:06:46 PM    Final     CT HEAD CODE STROKE WO CONTRAST  Result Date: 02/20/2021 CLINICAL DATA:  Code stroke. Blurred vision left eye. Left-sided facial droop. EXAM: CT HEAD WITHOUT CONTRAST TECHNIQUE: Contiguous axial images were obtained from the base of the skull through the vertex without intravenous contrast. COMPARISON:  None. FINDINGS: Brain: No evidence of acute large vascular territory infarction, hemorrhage, hydrocephalus, extra-axial collection or mass lesion/mass effect. Vascular: No hyperdense vessel identified. Skull: No acute fracture. Sinuses/Orbits: Visualized sinuses are clear.  Unremarkable orbits. Other: No mastoid effusions. ASPECTS [facility] Stroke Program Early CT Score) Total score (0-10 with 10 being normal): 10. IMPRESSION: No evidence of acute intracranial abnormality. ASPECTS is 10. Findings discussed with Dr. Fanny Bien at 7:08 p.m. via telephone. Electronically Signed   By: Feliberto Harts MD   On: 02/20/2021 19:12  Microbiology: Recent Results (from the past 240 hour(s))  Resp Panel by RT-PCR (Flu A&B, Covid) Nasopharyngeal Swab     Status: None   Collection Time: 02/20/21  7:38 PM   Specimen: Nasopharyngeal Swab; Nasopharyngeal(NP) swabs in vial transport medium  Result Value Ref Range Status   SARS Coronavirus 2 by RT PCR NEGATIVE NEGATIVE Final    Comment: (NOTE) SARS-CoV-2 target nucleic acids are NOT DETECTED.  The SARS-CoV-2 RNA is generally detectable in upper respiratory specimens during the acute phase of infection. The lowest concentration of SARS-CoV-2 viral copies this assay can detect is 138 copies/mL. A negative result does not preclude SARS-Cov-2 infection and should not be used as the sole basis for treatment or other patient management decisions. A negative result may occur with  improper specimen collection/handling, submission of specimen other than nasopharyngeal swab, presence of viral mutation(s) within the areas targeted by this assay, and inadequate number of  viral copies(<138 copies/mL). A negative result must be combined with clinical observations, patient history, and epidemiological information. The expected result is Negative.  Fact Sheet for Patients:  BloggerCourse.com  Fact Sheet for Healthcare Providers:  SeriousBroker.it  This test is no t yet approved or cleared by the [place] FDA and  has been authorized for detection and/or diagnosis of SARS-CoV-2 by FDA under an Emergency Use Authorization (EUA). This EUA will remain  in effect (meaning this test can be used) for the duration of the COVID-19 declaration under Section 564(b)(1) of the Act, 21 U.S.C.section 360bbb-3(b)(1), unless the authorization is terminated  or revoked sooner.       Influenza A by PCR NEGATIVE NEGATIVE Final   Influenza B by PCR NEGATIVE NEGATIVE Final    Comment: (NOTE) The Xpert Xpress SARS-CoV-2/FLU/RSV plus assay is intended as an aid in the diagnosis of influenza from Nasopharyngeal swab specimens and should not be used as a sole basis for treatment. Nasal washings and aspirates are unacceptable for Xpert Xpress SARS-CoV-2/FLU/RSV testing.  Fact Sheet for Patients: BloggerCourse.com  Fact Sheet for Healthcare Providers: SeriousBroker.it  This test is not yet approved or cleared by the [place] FDA and has been authorized for detection and/or diagnosis of SARS-CoV-2 by FDA under an Emergency Use Authorization (EUA). This EUA will remain in effect (meaning this test can be used) for the duration of the COVID-19 declaration under Section 564(b)(1) of the Act, 21 U.S.C. section 360bbb-3(b)(1), unless the authorization is terminated or revoked.  Performed at [facility], [address] [place], [place] [address]      Labs: Basic Metabolic Panel: Recent Labs  Lab 02/20/21 1903 02/21/21 0445 02/22/21 0602  NA 140 141  138  K 3.2* 3.8 3.5  CL 104 107 108  CO2 28 27 24   GLUCOSE 123* 99 107*  BUN 18 15 10   CREATININE 0.83 0.58 0.46  CALCIUM 9.4 8.6* 8.7*   Liver Function Tests: Recent Labs  Lab 02/20/21 1903  AST 22  ALT 20  ALKPHOS 60  BILITOT 0.7  PROT 7.2  ALBUMIN 4.1   No results for input(s): LIPASE, AMYLASE in the last 168 hours. No results for input(s): AMMONIA in the last 168 hours. CBC: Recent Labs  Lab 02/20/21 1903 02/21/21 0445  WBC 12.5* 11.7*  NEUTROABS 7.5  --   HGB 14.5 13.4  HCT 42.6 39.2  MCV 87.3 87.5  PLT 272 242   Cardiac Enzymes: No results for input(s): CKTOTAL, CKMB, CKMBINDEX, TROPONINI in the last 168 hours. BNP: BNP (last 3  results) No results for input(s): BNP in the last 8760 hours.  ProBNP (last 3 results) No results for input(s): PROBNP in the last 8760 hours.  CBG: Recent Labs  Lab 02/20/21 1849 02/21/21 0740  GLUCAP 138* 122*       Signed:  Ramiro Harvest MD.  [facility] 02/22/2021, 4:43 PM

## 2021-03-21 ENCOUNTER — Emergency Department (HOSPITAL_COMMUNITY): Payer: BC Managed Care – PPO

## 2021-03-21 ENCOUNTER — Other Ambulatory Visit: Payer: Self-pay

## 2021-03-21 ENCOUNTER — Emergency Department (HOSPITAL_COMMUNITY)
Admission: EM | Admit: 2021-03-21 | Discharge: 2021-03-21 | Disposition: A | Discharge status: Home / Self Care | Payer: BC Managed Care – PPO | Attending: Emergency Medicine | Admitting: Emergency Medicine

## 2021-03-21 DIAGNOSIS — R531 Weakness: Secondary | ICD-10-CM | POA: Insufficient documentation

## 2021-03-21 DIAGNOSIS — Z8616 Personal history of COVID-19: Secondary | ICD-10-CM | POA: Diagnosis not present

## 2021-03-21 DIAGNOSIS — R791 Abnormal coagulation profile: Secondary | ICD-10-CM | POA: Diagnosis not present

## 2021-03-21 DIAGNOSIS — Z5321 Procedure and treatment not carried out due to patient leaving prior to being seen by health care provider: Secondary | ICD-10-CM | POA: Insufficient documentation

## 2021-03-21 DIAGNOSIS — R200 Anesthesia of skin: Secondary | ICD-10-CM | POA: Insufficient documentation

## 2021-03-21 LAB — CBC WITH DIFFERENTIAL/PLATELET
Abs Immature Granulocytes: 0.02 10*3/uL (ref 0.00–0.07)
Basophils Absolute: 0.1 10*3/uL (ref 0.0–0.1)
Basophils Relative: 1 %
Eosinophils Absolute: 0 10*3/uL (ref 0.0–0.5)
Eosinophils Relative: 1 %
HCT: 42.4 % (ref 36.0–46.0)
Hemoglobin: 14.2 g/dL (ref 12.0–15.0)
Immature Granulocytes: 0 %
Lymphocytes Relative: 19 %
Lymphs Abs: 1.5 10*3/uL (ref 0.7–4.0)
MCH: 29.3 pg (ref 26.0–34.0)
MCHC: 33.5 g/dL (ref 30.0–36.0)
MCV: 87.4 fL (ref 80.0–100.0)
Monocytes Absolute: 0.5 10*3/uL (ref 0.1–1.0)
Monocytes Relative: 7 %
Neutro Abs: 5.7 10*3/uL (ref 1.7–7.7)
Neutrophils Relative %: 72 %
Platelets: 339 10*3/uL (ref 150–400)
RBC: 4.85 MIL/uL (ref 3.87–5.11)
RDW: 13 % (ref 11.5–15.5)
WBC: 7.9 10*3/uL (ref 4.0–10.5)
nRBC: 0 % (ref 0.0–0.2)

## 2021-03-21 LAB — URINALYSIS, ROUTINE W REFLEX MICROSCOPIC
Glucose, UA: NEGATIVE mg/dL
Hgb urine dipstick: NEGATIVE
Ketones, ur: 5 mg/dL — AB
Nitrite: NEGATIVE
Protein, ur: 30 mg/dL — AB
Specific Gravity, Urine: 1.032 — ABNORMAL HIGH (ref 1.005–1.030)
pH: 7 (ref 5.0–8.0)

## 2021-03-21 LAB — PROTIME-INR
INR: 1 (ref 0.8–1.2)
Prothrombin Time: 13.1 seconds (ref 11.4–15.2)

## 2021-03-21 LAB — BASIC METABOLIC PANEL
Anion gap: 13 (ref 5–15)
BUN: 16 mg/dL (ref 8–23)
CO2: 18 mmol/L — ABNORMAL LOW (ref 22–32)
Calcium: 9.3 mg/dL (ref 8.9–10.3)
Chloride: 106 mmol/L (ref 98–111)
Creatinine, Ser: 0.74 mg/dL (ref 0.44–1.00)
GFR, Estimated: >60 mL/min (ref >60)
Glucose, Bld: 150 mg/dL — ABNORMAL HIGH (ref 70–99)
Potassium: 3.3 mmol/L — ABNORMAL LOW (ref 3.5–5.1)
Sodium: 137 mmol/L (ref 135–145)

## 2021-03-21 IMAGING — CT CT HEAD W/O CM
4 series · 16 of 47 positions shown, 18 images · non-contrast
Comparison: MRI [DATE], CT head [DATE]

CLINICAL DATA: Transient ischemic attack (TIA)

EXAM:
CT HEAD WITHOUT CONTRAST
TECHNIQUE: Contiguous axial images were obtained from the base of the skull
through the vertex without intravenous contrast.

[Series 3: head bone · axial · 0.42mm/px · z∈[-100,-72]mm · 3 of 73 slices shown]
[im 8/73  bone]
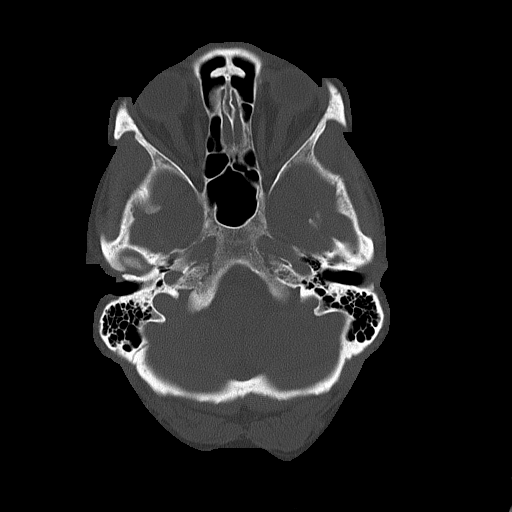
[im 15/73  bone]
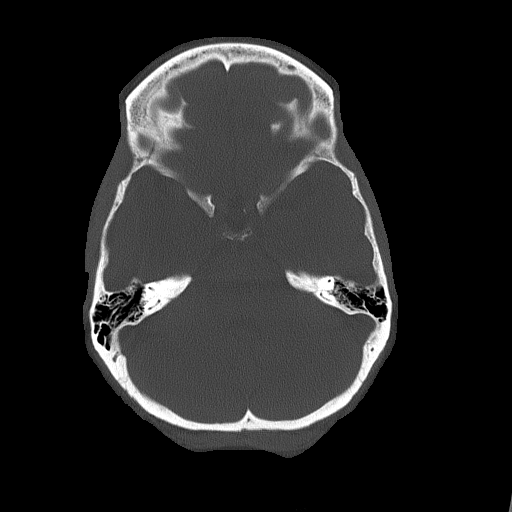
[im 22/73  bone]
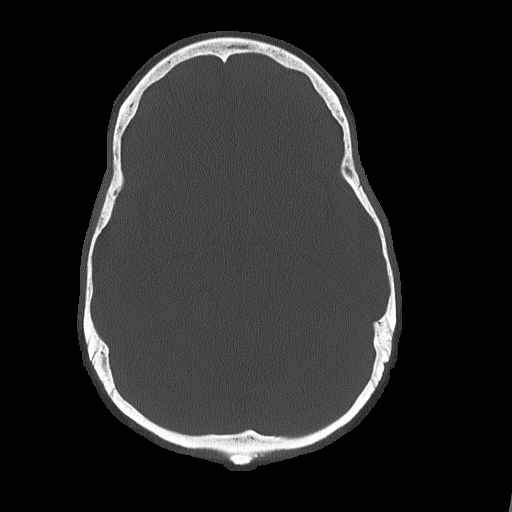

[Series 4: head without · axial · non-contrast · 0.42mm/px · z∈[-99,+6]mm · 7 of 29 slices shown, 9 images]
[im 4/29  brain]
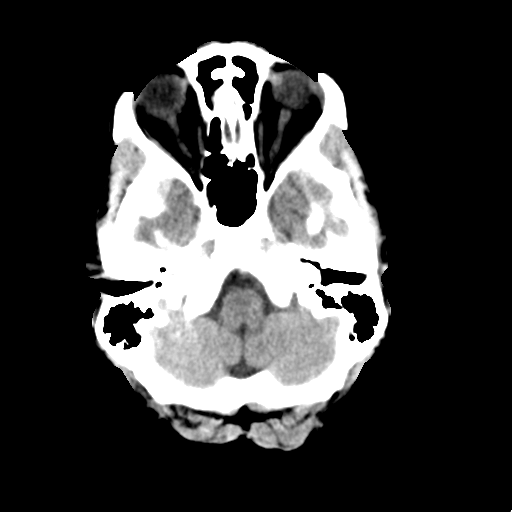
[im 4/29  bone]
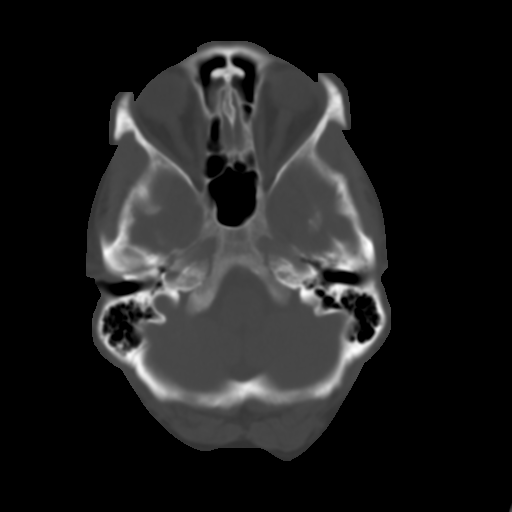
[im 8/29  brain]
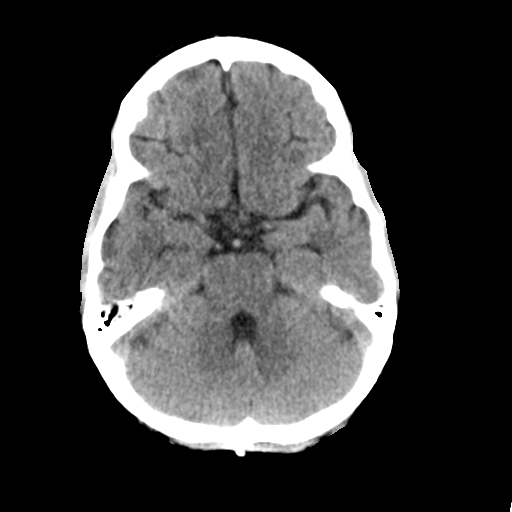
[im 11/29  brain]
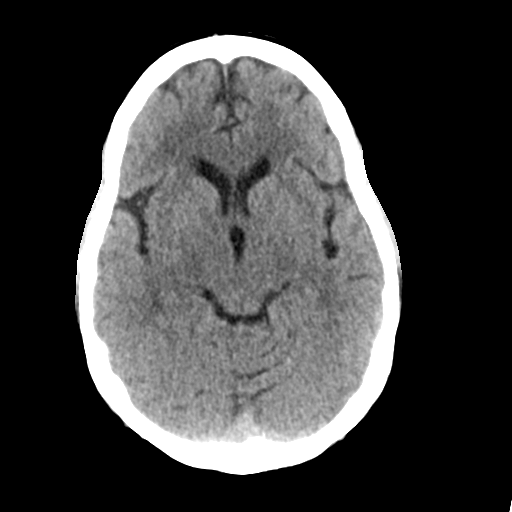
[im 15/29  brain]
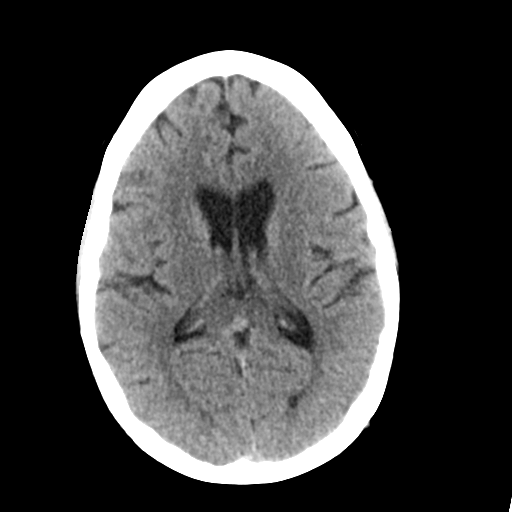
[im 18/29  brain]
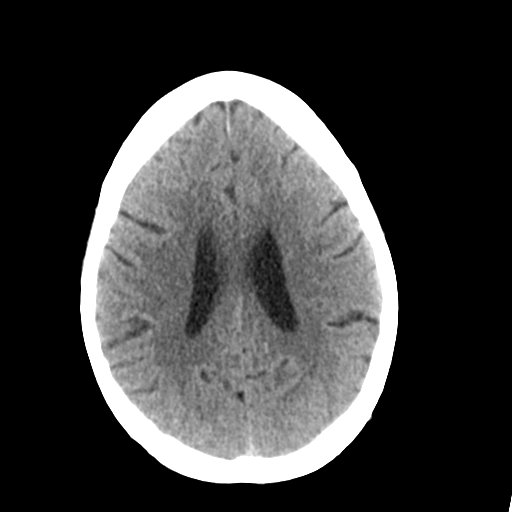
[im 18/29  bone]
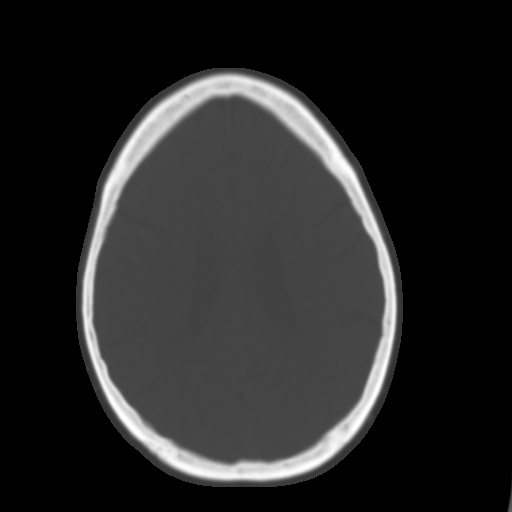
[im 22/29  brain]
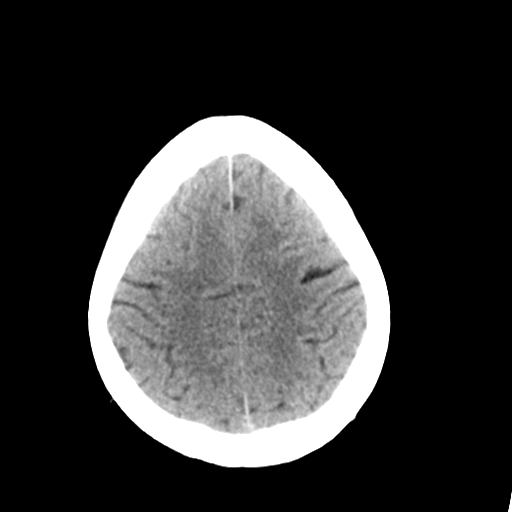
[im 25/29  brain]
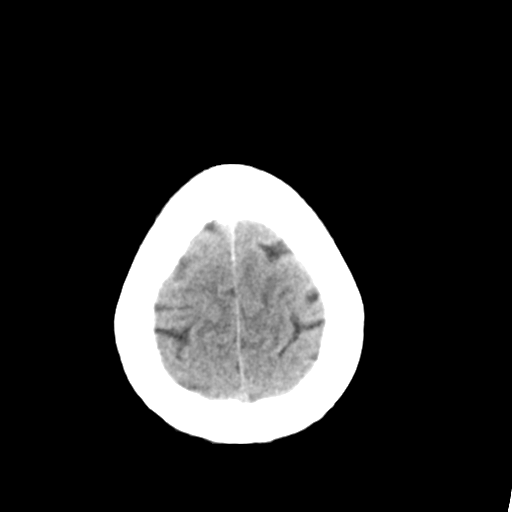

[Series 5: head without cor · coronal · non-contrast · 0.30mm/px · 3 of 65 slices shown]
[im 22/65  brain]
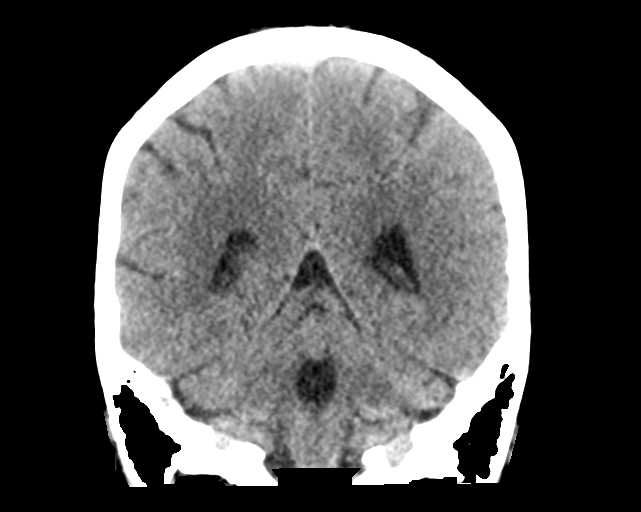
[im 29/65  brain]
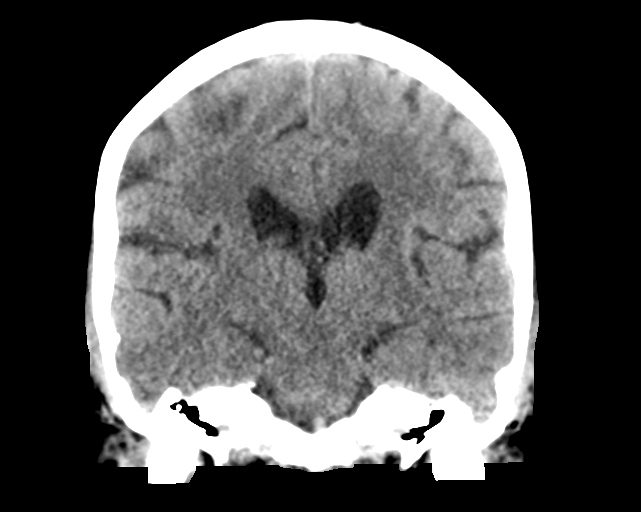
[im 36/65  brain]
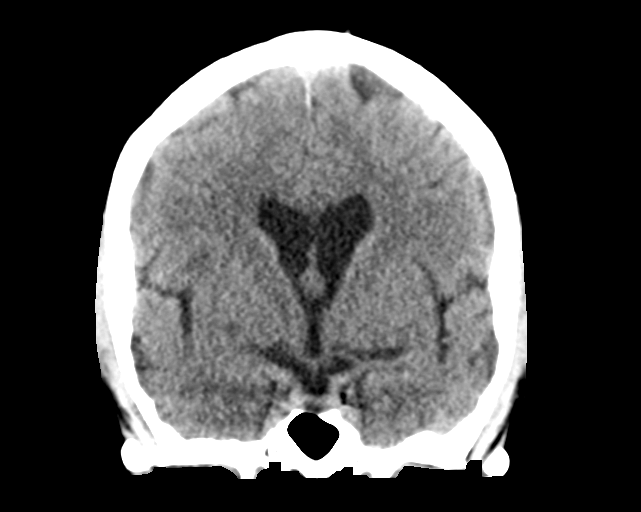

[Series 6: head without sag · sagittal · non-contrast · 0.30mm/px · 3 of 49 slices shown]
[im 17/49  brain]
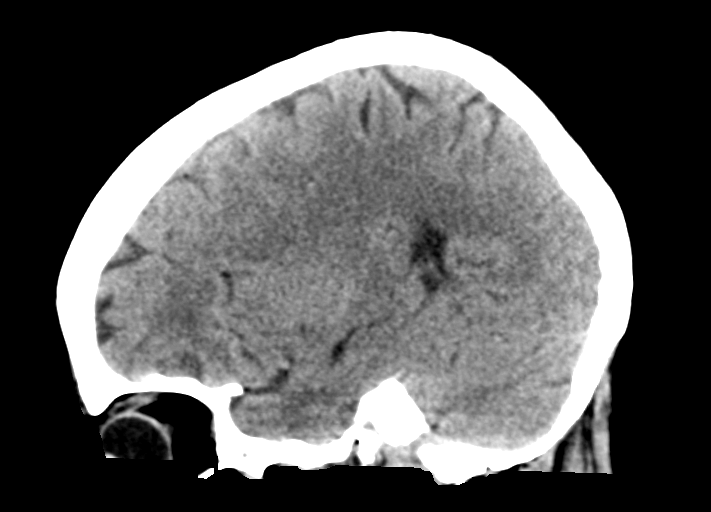
[im 25/49  brain]
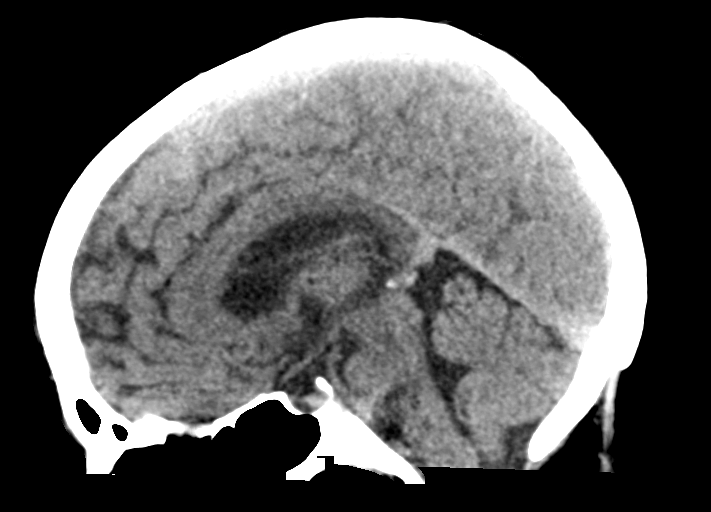
[im 33/49  brain]
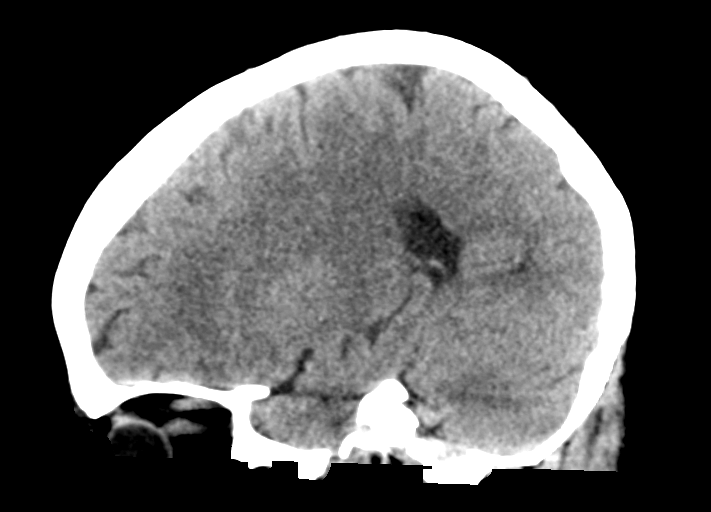

[16 of 47 positions shown; findings below may reference images not displayed]

FINDINGS: Brain: No evidence of acute infarction, hemorrhage, hydrocephalus,
extra-axial collection or mass lesion/mass effect.The ventricles are
normal in size.

Vascular: No hyperdense vessel or unexpected calcification.

Skull: Normal. Negative for fracture or focal lesion.

Sinuses/Orbits: No acute finding.

Other: None.
IMPRESSION: No acute intracranial abnormality.

## 2021-03-21 NOTE — ED Notes (Signed)
The patient decided to leave without being seen by a provider. The patient was informed that if her symptoms worsened to seek medical attention

## 2021-03-21 NOTE — ED Provider Notes (Addendum)
Emergency Medicine Provider Triage Evaluation Note  April Lindsey , a 65 y.o. female  was evaluated in triage.  Pt complains of bilateral leg weakness, being ordered for the last 3 weeks, states it is constant, states she also has some numbness in her lower extremity as well as her upper extremities, she denies recent falls, is not on anticoagulant.  She has a history of fibromyalgia, but states this does not feel like her normal fibromyalgia attacks.  She has no associated headaches, change in vision, no other complaints at this time.  Recently seen at [facility] for similar presentation with benign work-up.  Review of Systems  Positive: Bilateral leg weakness, paresthesias Negative: Headaches, change in vision  Physical Exam  BP 113/74 (BP Location: Right Arm)   Pulse 83   Temp 97.6 F (36.4 C) (Oral)   Resp 18   Ht 5\' 2"  (1.575 m)   Wt 68.9 kg   SpO2 100%   BMI 27.80 kg/m  Gen:   Awake, no distress   Resp:  Normal effort  MSK:   Moves extremities without difficulty  Other:  No facial asymmetry, no unilateral weakness, able to follow commands, negative focal deficits  Medical Decision Making  Medically screening exam initiated at 12:20 PM.  Appropriate orders placed.  was informed that the remainder of the evaluation will be completed by another provider, this initial triage assessment does not replace that evaluation, and the importance of remaining in the ED until their evaluation is complete.  Patient presents with weakness, x3 weeks, will need further work-up   03/21/21 1222    Tegeler, 03/23/21, MD 03/21/21 1246    03/23/21, PA-C 03/21/21 1952    Tegeler, 03/23/21, MD 03/22/21 [phone]

## 2021-03-21 NOTE — ED Triage Notes (Signed)
Pt here POV with complaints of right lower extremity that is going from warm to cold, and weak with numbness. Pt reports having covid in May. Fibromyalgia flare. Pt alert and oriented. Pedal pulses present. Stroke screen negative.

## 2021-03-26 ENCOUNTER — Ambulatory Visit: Payer: BC Managed Care – PPO

## 2021-07-23 ENCOUNTER — Other Ambulatory Visit: Payer: Self-pay | Admitting: Nurse Practitioner

## 2021-07-23 DIAGNOSIS — R748 Abnormal levels of other serum enzymes: Secondary | ICD-10-CM

## 2021-07-29 ENCOUNTER — Other Ambulatory Visit: Payer: Self-pay | Admitting: Nurse Practitioner

## 2021-07-29 DIAGNOSIS — R748 Abnormal levels of other serum enzymes: Secondary | ICD-10-CM

## 2021-07-30 ENCOUNTER — Ambulatory Visit
Admission: RE | Admit: 2021-07-30 | Discharge: 2021-07-30 | Disposition: A | Discharge status: Home / Self Care | Payer: BC Managed Care – PPO | Source: Ambulatory Visit | Attending: Nurse Practitioner | Admitting: Nurse Practitioner

## 2021-07-30 ENCOUNTER — Other Ambulatory Visit: Payer: Self-pay

## 2021-07-30 DIAGNOSIS — R748 Abnormal levels of other serum enzymes: Secondary | ICD-10-CM | POA: Insufficient documentation

## 2021-07-30 IMAGING — US US HEPATIC LIVER DOPPLER
1 series · 13 of 25 positions shown · non-contrast
Comparison: None.

CLINICAL DATA: Elevated LFTs.

EXAM:
1. ABDOMINAL ULTRASOUND
2. DUPLEX ULTRASOUND OF LIVER
TECHNIQUE: Standard abdominal ultrasound was performed followed by dedicated
color and duplex Doppler ultrasound to evaluate the hepatic in-flow
and out-flow vessels.

[Series 1: us abdomen complete · 13 of 97 slices shown]
[im 1/97]
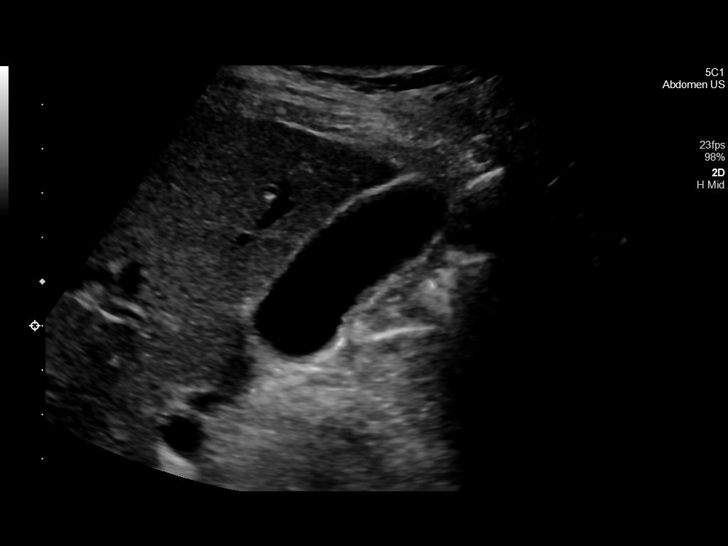
[im 9/97]
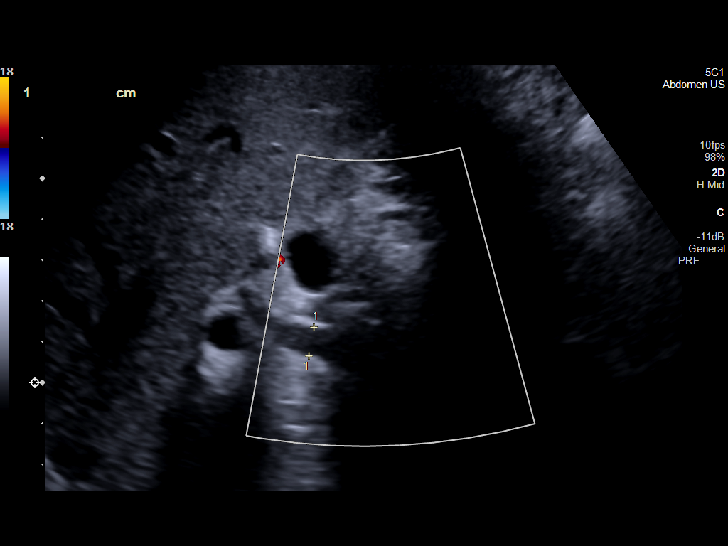
[im 17/97]
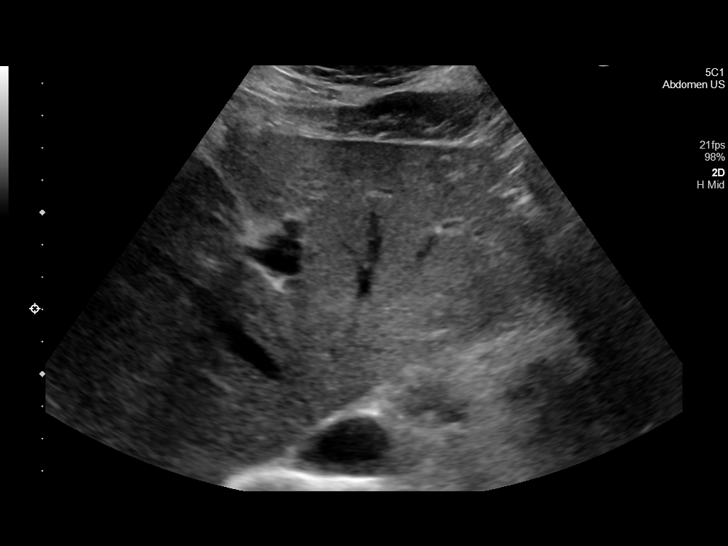
[im 25/97]
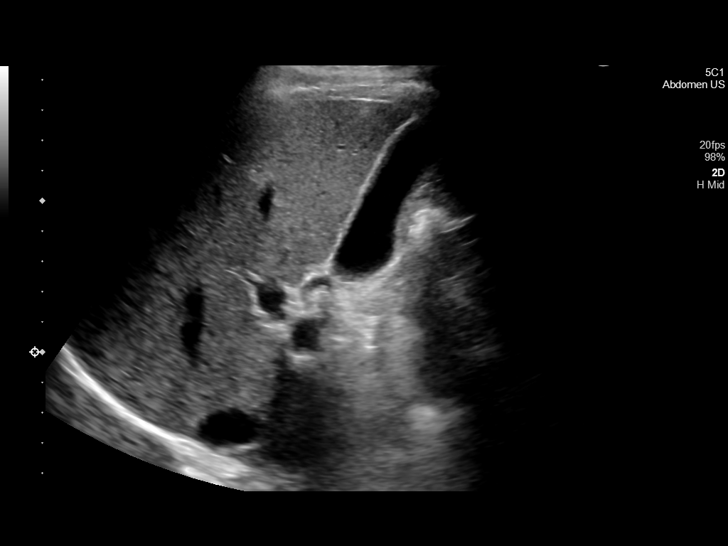
[im 33/97]
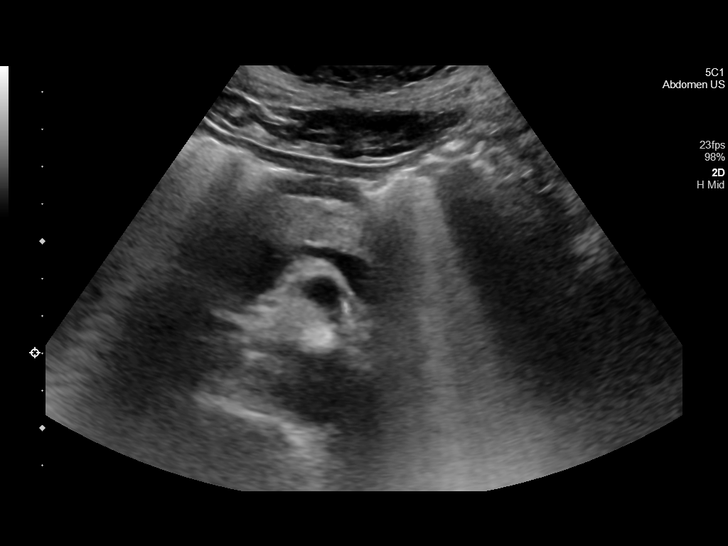
[im 41/97]
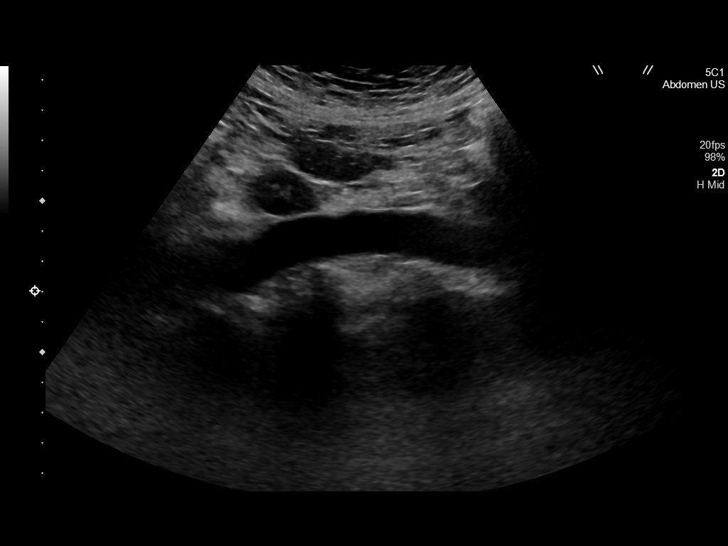
[im 49/97]
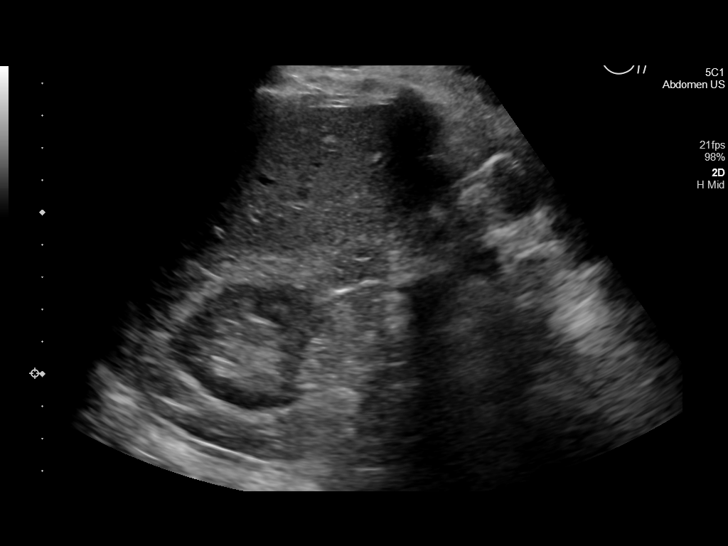
[im 57/97]
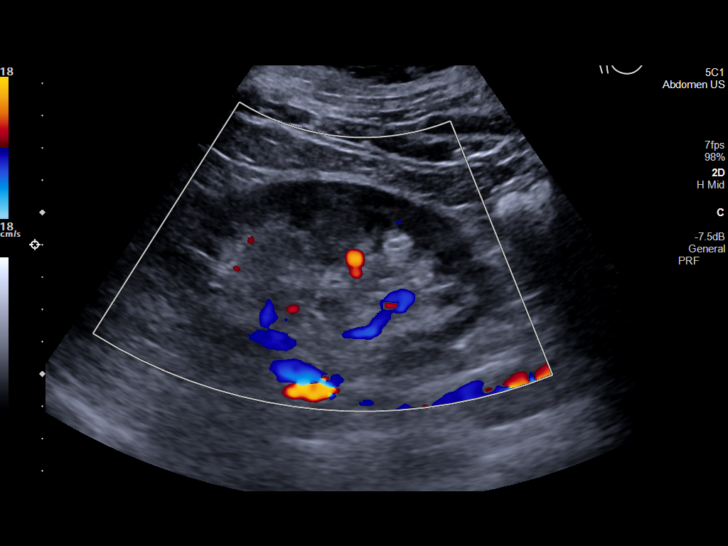
[im 65/97]
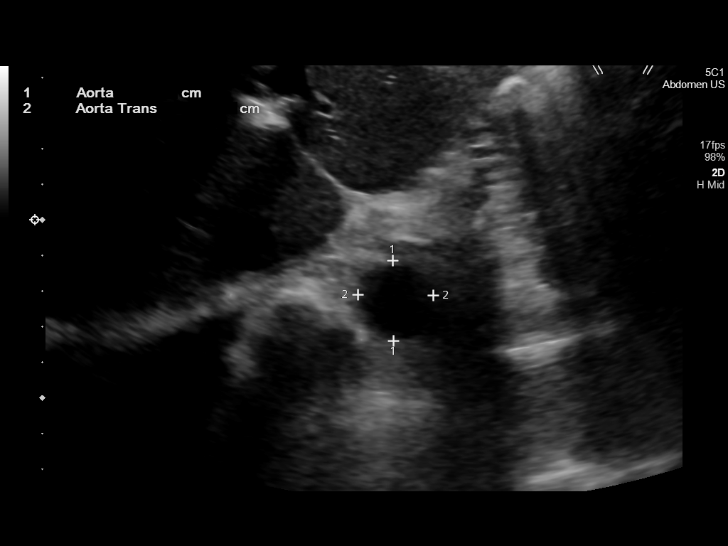
[im 73/97]
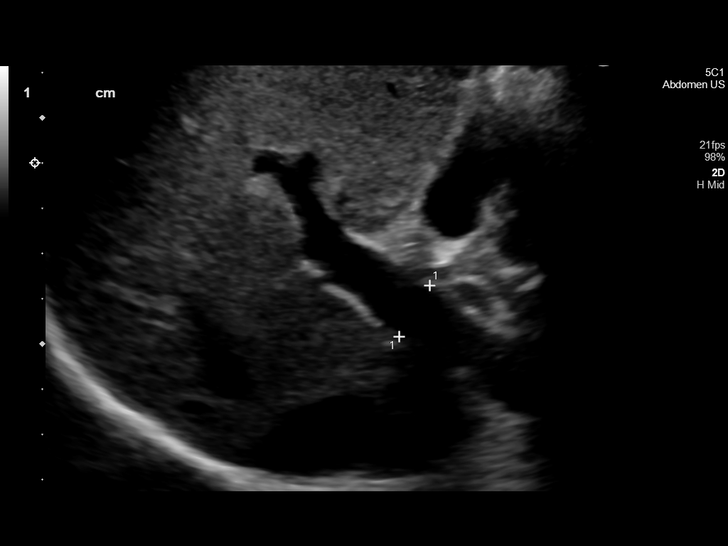
[im 81/97]
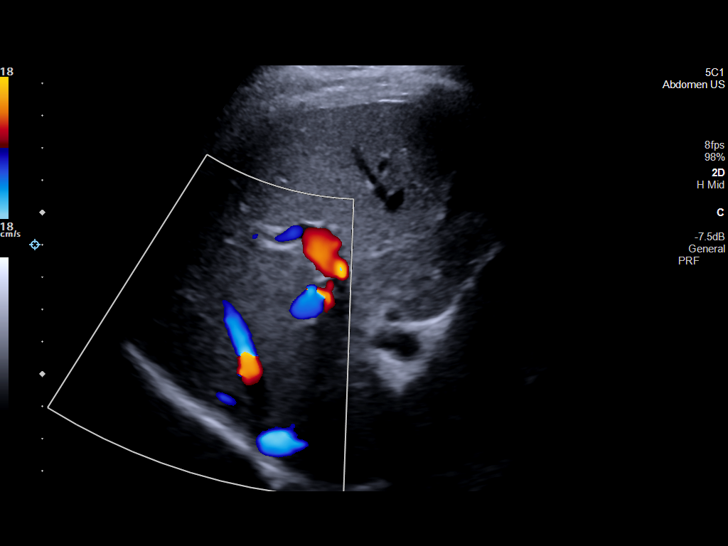
[im 89/97]
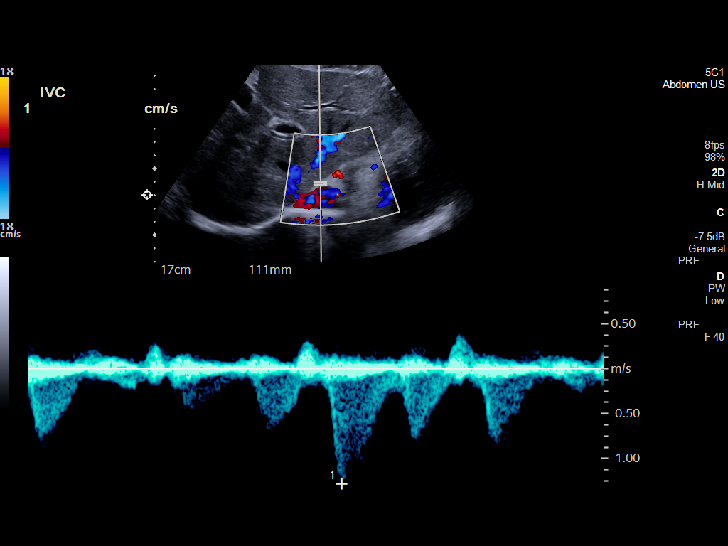
[im 97/97]
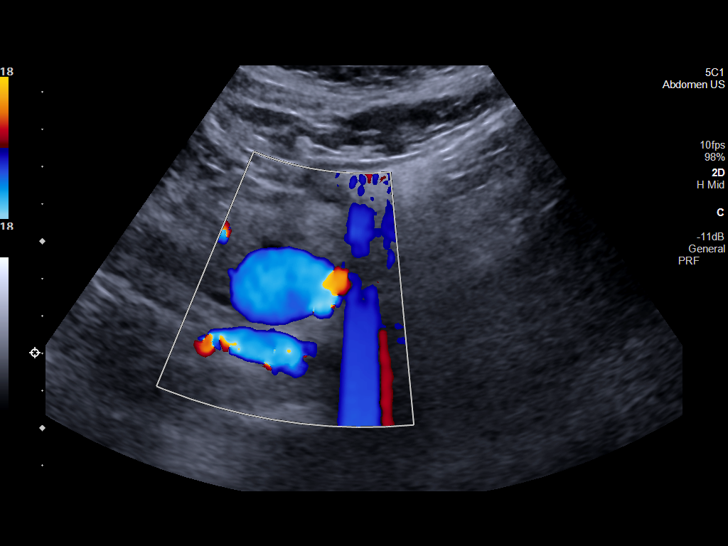

[13 of 25 positions shown; findings below may reference images not displayed]

FINDINGS: Hepatic: Normal sonographic appearance of the liver. No definitive
nodularity of the hepatic contour. No discrete hepatic lesions. No
intrahepatic biliary ductal dilatation.

Gallbladder: Normal sonographic appearance of the gallbladder. No
gallbladder wall thickening or pericholecystic stranding. No
echogenic stones or gallbladder sludge. Negative sonographic Murphy
sign.

CBD: Normal in size measuring 7.1 mm in diameter

Portal Vein Velocities (normal hepatopetal directional flow)

Main:  19 cm/sec

Right:  13 cm/sec

Left:  16 cm/sec

Hepatic Vein Velocities (normal hepatofugal directional flow)

Right:  31 cm/sec

Middle:  16 cm/sec

Left:  44 cm/sec

Hepatic Artery Velocity: 53 cm/sec

Splenic Vein Velocity: 20 cm/sec

IVC: 128 centimeters/second

Varices: None visualized

Ascites: None visualized

Spleen: Normal in size measuring 9.4 x 6.4 x 4.1 with calculated
volume of 130 cc

Abdominal aorta: Normal in size measuring 2.3 cm in diameter

_________________________________________________________

Right kidney: Normal cortical thickness, echogenicity and size,
measuring 7.2 cm in length. No focal renal lesions. No echogenic
renal stones. No urinary obstruction.

Left kidney: Normal cortical thickness, echogenicity and size,
measuring 11.0 cm in length. No focal renal lesions. No echogenic
renal stones. No urinary obstruction.

Pancreas: Limited visualization of the pancreatic head and neck is
normal. Visualization of the pancreatic body and tail is obscured by
bowel gas.
IMPRESSION: 1. No definite explanation for patient's elevated LFTs.
Specifically, no discrete hepatic lesions and no evidence of intra
or extrahepatic biliary ductal dilatation. Further evaluation with
MRCP could be performed as indicated.
2. No evidence of cholelithiasis or cholecystitis.
3. Normal hepatic Doppler ultrasound with normal directional flow
and acquired velocities.
4. No sonographic evidence of portal venous hypertension.
Specifically, no evidence of ascites or splenomegaly.

## 2021-07-30 IMAGING — US US ABDOMEN COMPLETE
1 series · 13 of 25 positions shown · non-contrast
Comparison: None.

CLINICAL DATA: Elevated LFTs.

EXAM:
1. ABDOMINAL ULTRASOUND
2. DUPLEX ULTRASOUND OF LIVER
TECHNIQUE: Standard abdominal ultrasound was performed followed by dedicated
color and duplex Doppler ultrasound to evaluate the hepatic in-flow
and out-flow vessels.

[Series 1: us abdomen complete · 13 of 97 slices shown]
[im 1/97]
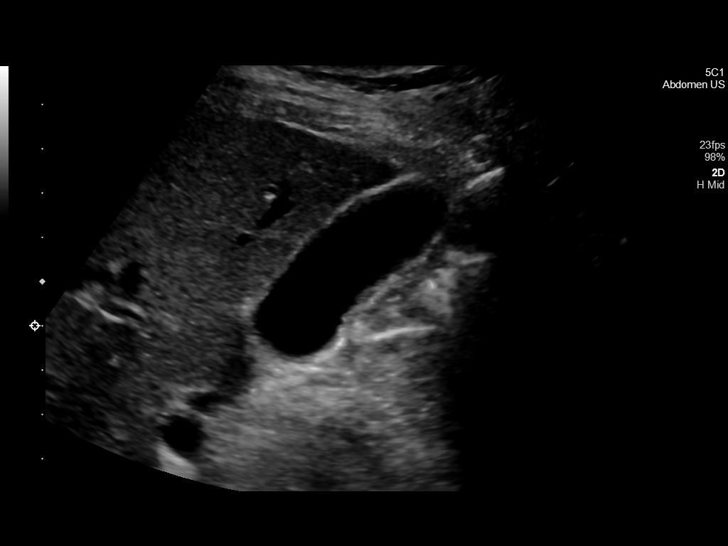
[im 9/97]
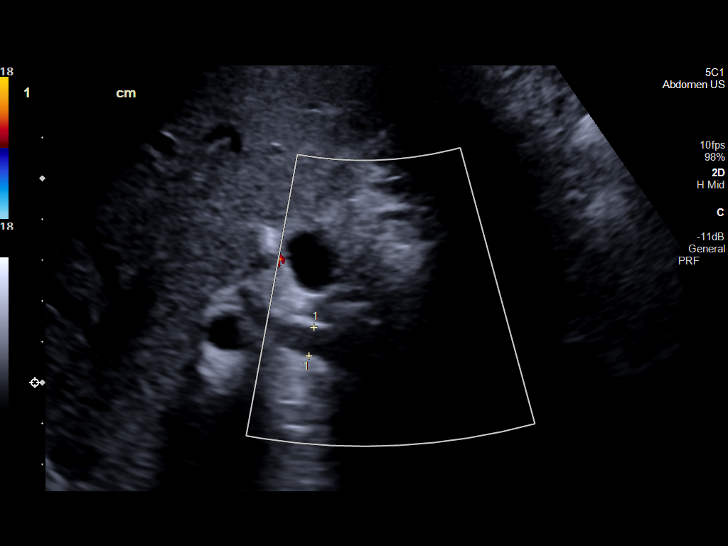
[im 17/97]
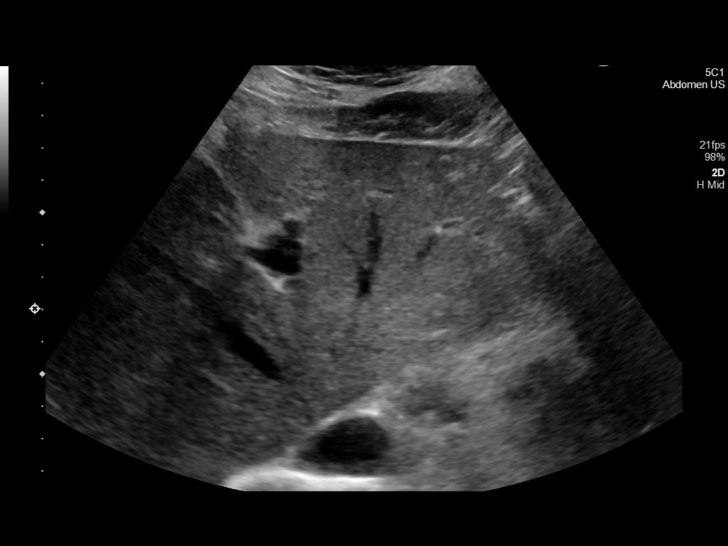
[im 25/97]
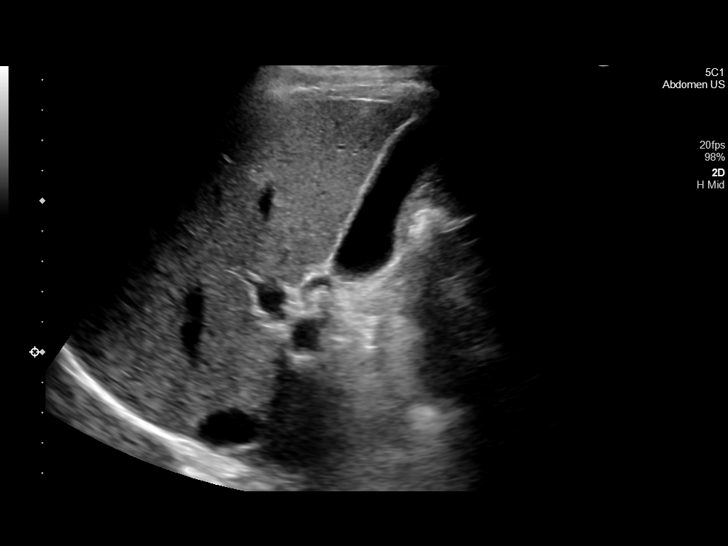
[im 33/97]
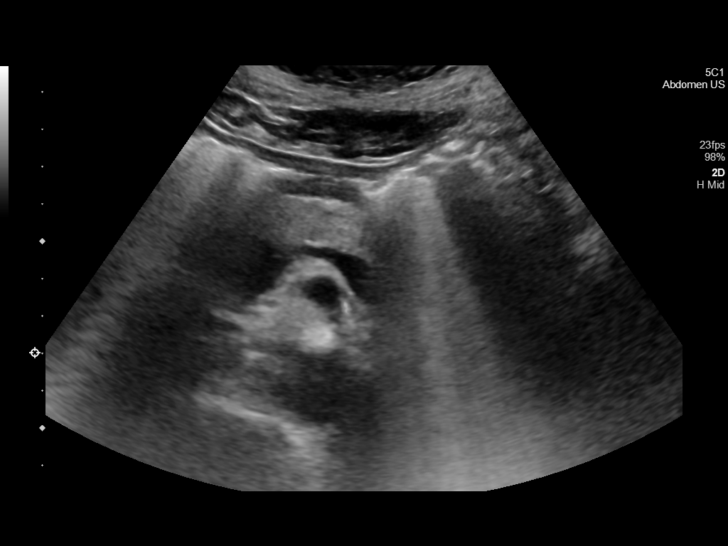
[im 41/97]
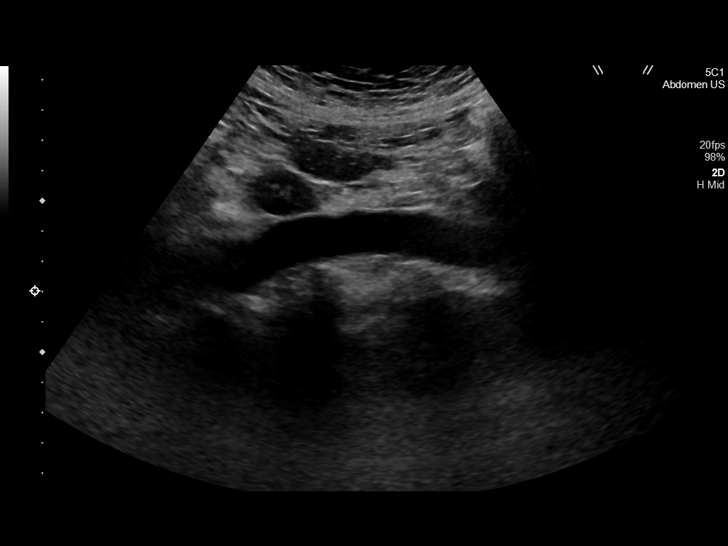
[im 49/97]
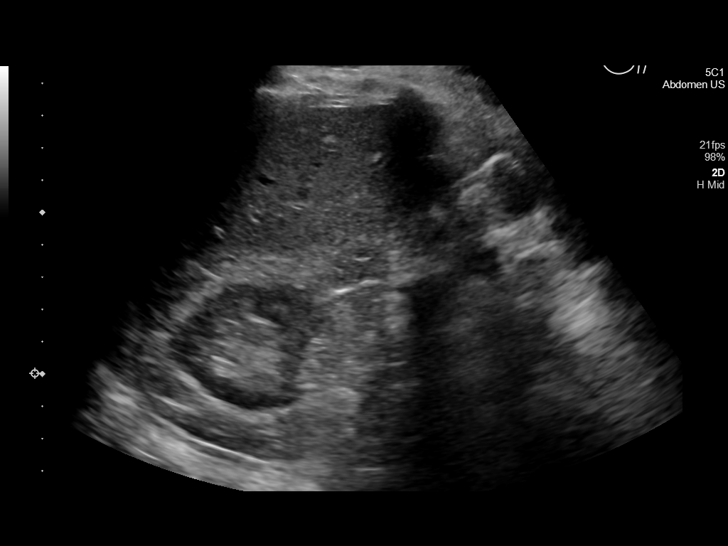
[im 57/97]
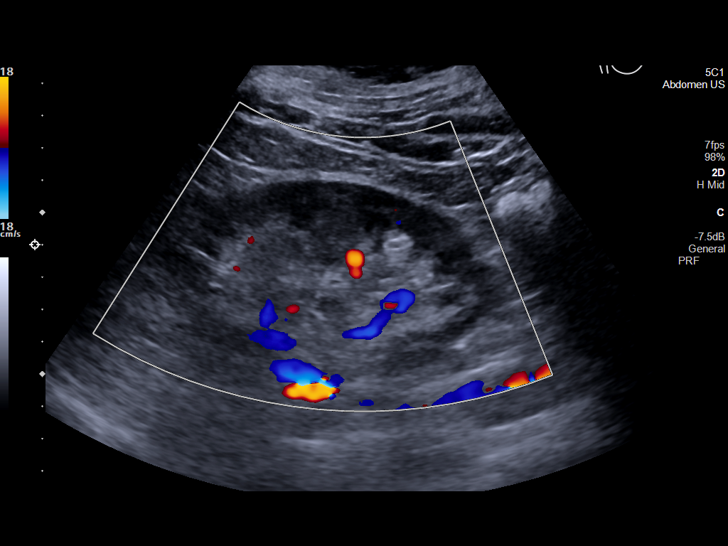
[im 65/97]
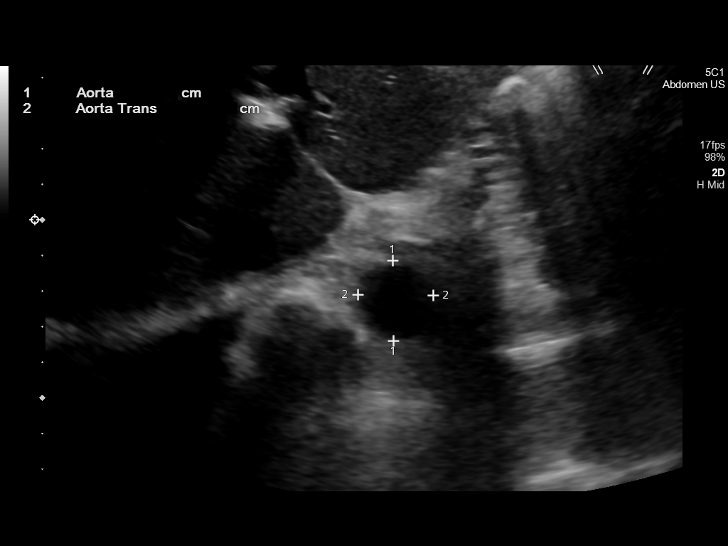
[im 73/97]
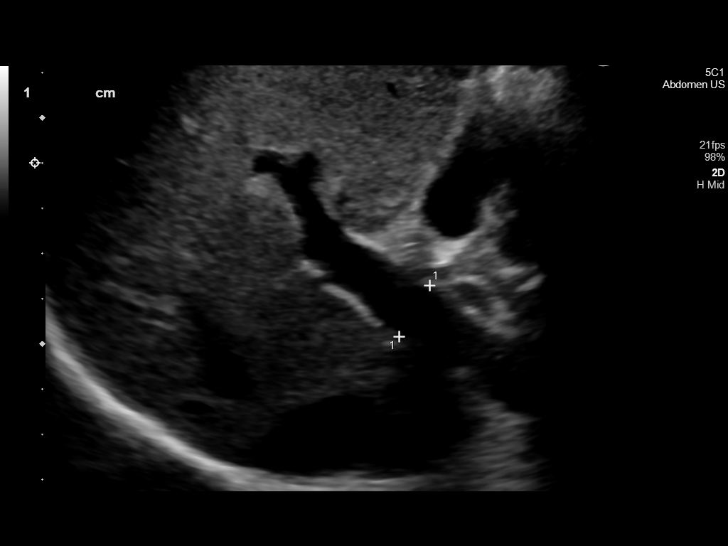
[im 81/97]
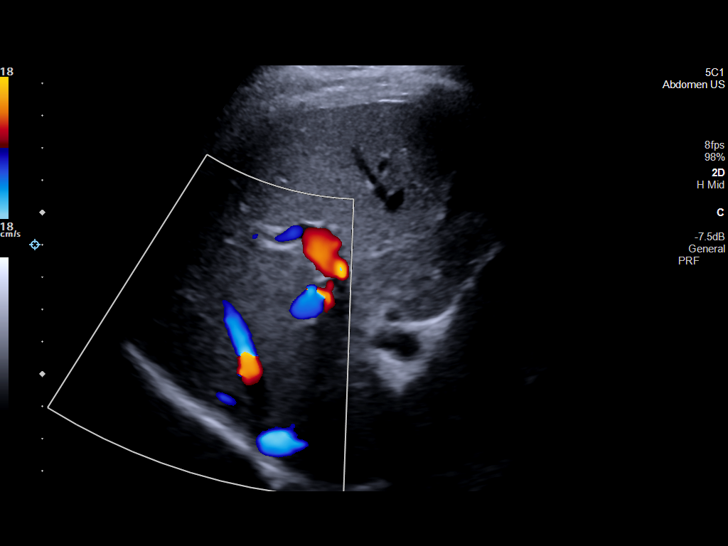
[im 89/97]
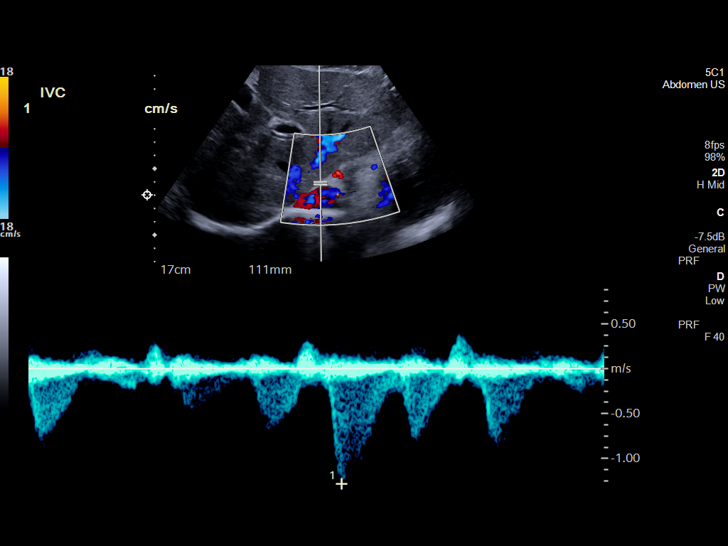
[im 97/97]
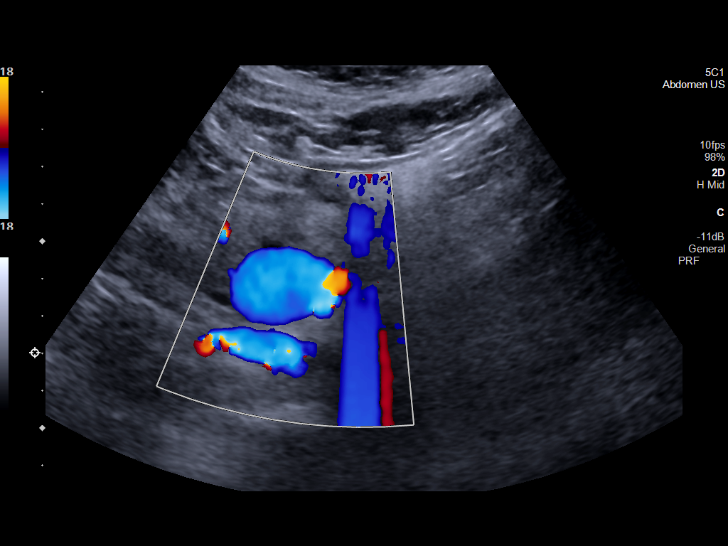

[13 of 25 positions shown; findings below may reference images not displayed]

FINDINGS: Hepatic: Normal sonographic appearance of the liver. No definitive
nodularity of the hepatic contour. No discrete hepatic lesions. No
intrahepatic biliary ductal dilatation.

Gallbladder: Normal sonographic appearance of the gallbladder. No
gallbladder wall thickening or pericholecystic stranding. No
echogenic stones or gallbladder sludge. Negative sonographic Murphy
sign.

CBD: Normal in size measuring 7.1 mm in diameter

Portal Vein Velocities (normal hepatopetal directional flow)

Main:  19 cm/sec

Right:  13 cm/sec

Left:  16 cm/sec

Hepatic Vein Velocities (normal hepatofugal directional flow)

Right:  31 cm/sec

Middle:  16 cm/sec

Left:  44 cm/sec

Hepatic Artery Velocity: 53 cm/sec

Splenic Vein Velocity: 20 cm/sec

IVC: 128 centimeters/second

Varices: None visualized

Ascites: None visualized

Spleen: Normal in size measuring 9.4 x 6.4 x 4.1 with calculated
volume of 130 cc

Abdominal aorta: Normal in size measuring 2.3 cm in diameter

_________________________________________________________

Right kidney: Normal cortical thickness, echogenicity and size,
measuring 7.2 cm in length. No focal renal lesions. No echogenic
renal stones. No urinary obstruction.

Left kidney: Normal cortical thickness, echogenicity and size,
measuring 11.0 cm in length. No focal renal lesions. No echogenic
renal stones. No urinary obstruction.

Pancreas: Limited visualization of the pancreatic head and neck is
normal. Visualization of the pancreatic body and tail is obscured by
bowel gas.
IMPRESSION: 1. No definite explanation for patient's elevated LFTs.
Specifically, no discrete hepatic lesions and no evidence of intra
or extrahepatic biliary ductal dilatation. Further evaluation with
MRCP could be performed as indicated.
2. No evidence of cholelithiasis or cholecystitis.
3. Normal hepatic Doppler ultrasound with normal directional flow
and acquired velocities.
4. No sonographic evidence of portal venous hypertension.
Specifically, no evidence of ascites or splenomegaly.

## 2021-10-28 ENCOUNTER — Other Ambulatory Visit: Payer: Self-pay | Admitting: Physician Assistant

## 2021-10-28 DIAGNOSIS — Z1231 Encounter for screening mammogram for malignant neoplasm of breast: Secondary | ICD-10-CM

## 2021-10-29 ENCOUNTER — Ambulatory Visit
Admission: RE | Admit: 2021-10-29 | Discharge: 2021-10-29 | Disposition: A | Discharge status: Home / Self Care | Payer: BC Managed Care – PPO | Source: Ambulatory Visit | Attending: Physician Assistant | Admitting: Physician Assistant

## 2021-10-29 ENCOUNTER — Other Ambulatory Visit: Payer: Self-pay

## 2021-10-29 DIAGNOSIS — Z1231 Encounter for screening mammogram for malignant neoplasm of breast: Secondary | ICD-10-CM | POA: Diagnosis not present

## 2021-10-29 IMAGING — MG MM DIGITAL SCREENING BILAT W/ TOMO AND CAD
8 series · 8 of 24 positions shown · non-contrast
Comparison: Previous exam(s).

CLINICAL DATA: Screening.

EXAM:
DIGITAL SCREENING BILATERAL MAMMOGRAM WITH TOMOSYNTHESIS AND CAD
TECHNIQUE: Bilateral screening digital craniocaudal and mediolateral oblique
mammograms were obtained. Bilateral screening digital breast
tomosynthesis was performed. The images were evaluated with
computer-aided detection.

[L MLO synth-2D]
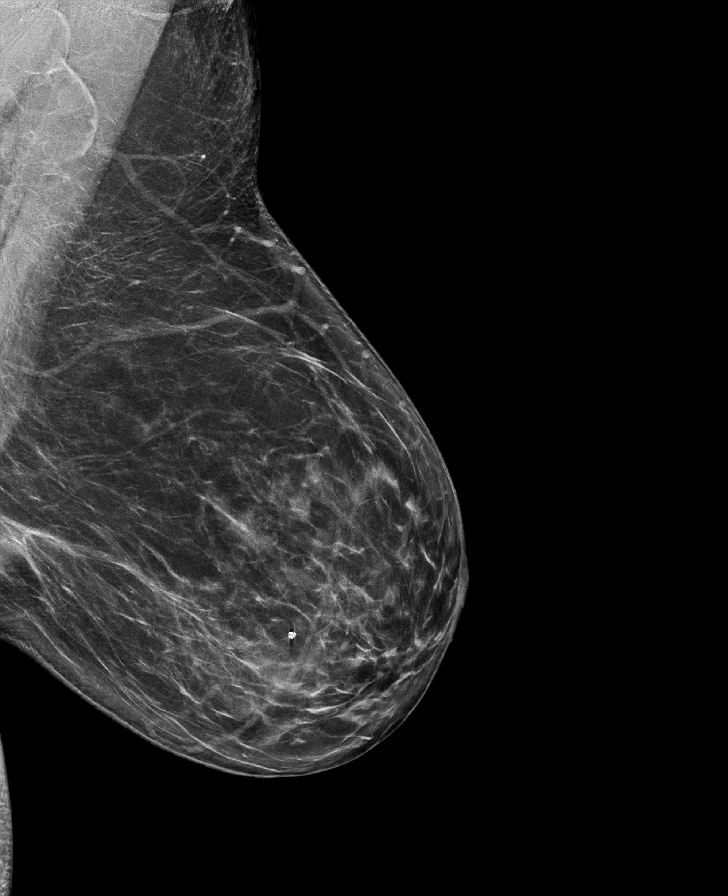

[L CC synth-2D]
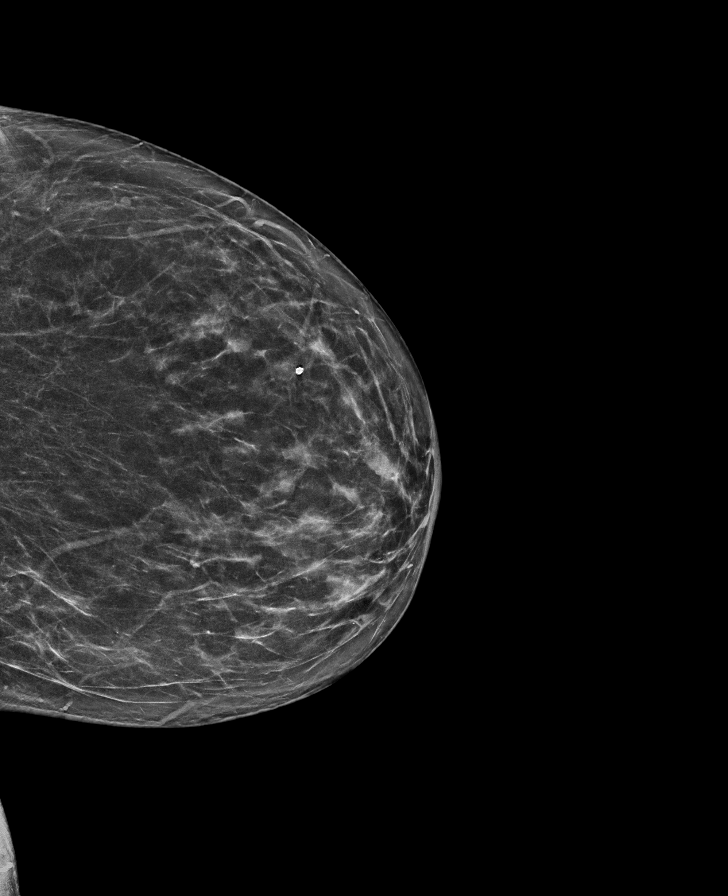

[R CC synth-2D]
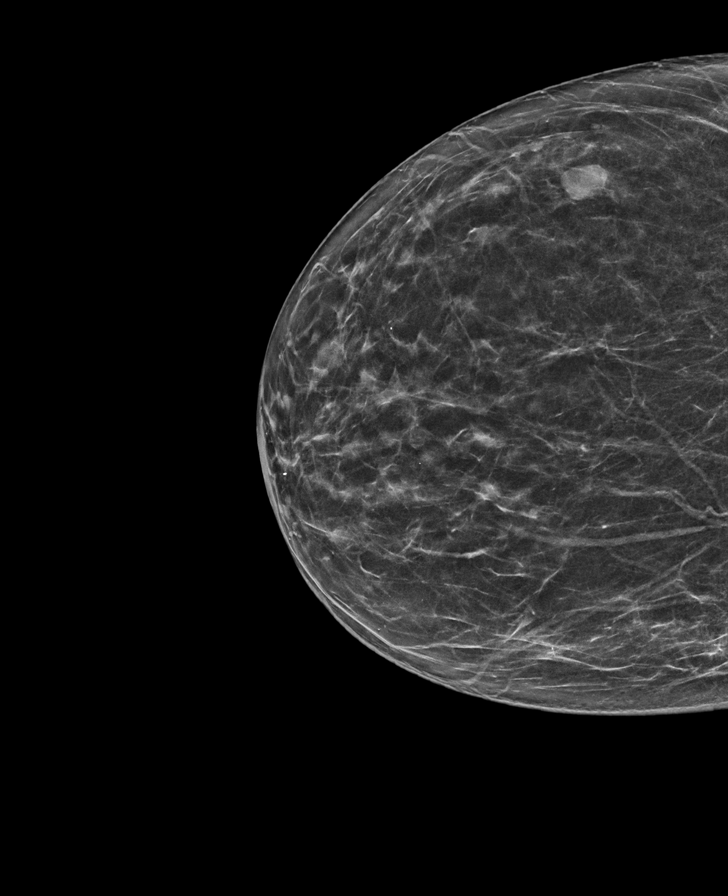

[R MLO synth-2D]
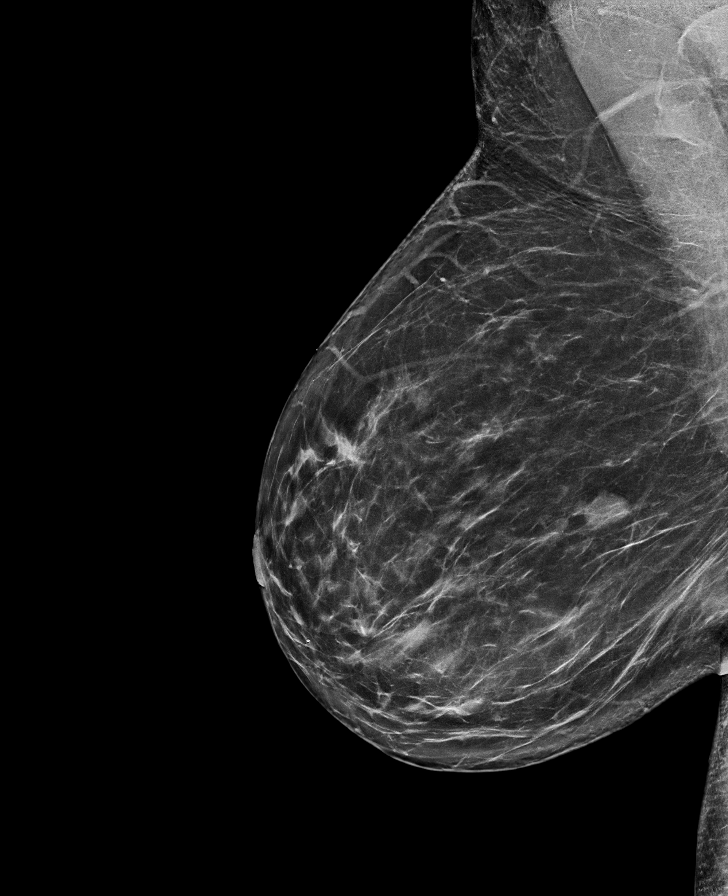

[L CC tomo · tomo slice 35/70.0]
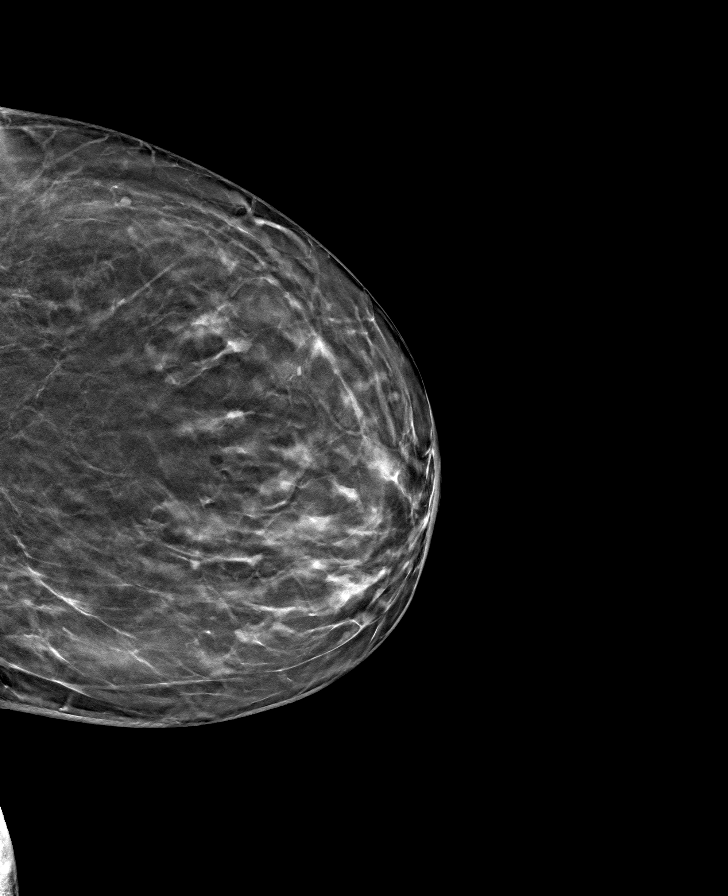

[L MLO tomo · tomo slice 43/85.0]
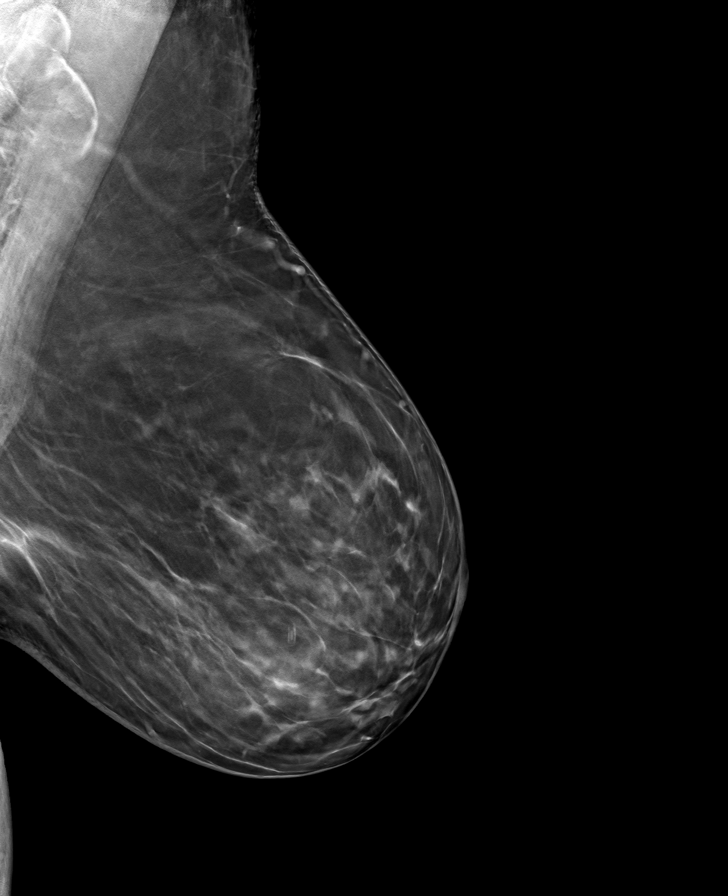

[R MLO tomo · tomo slice 41/80.0]
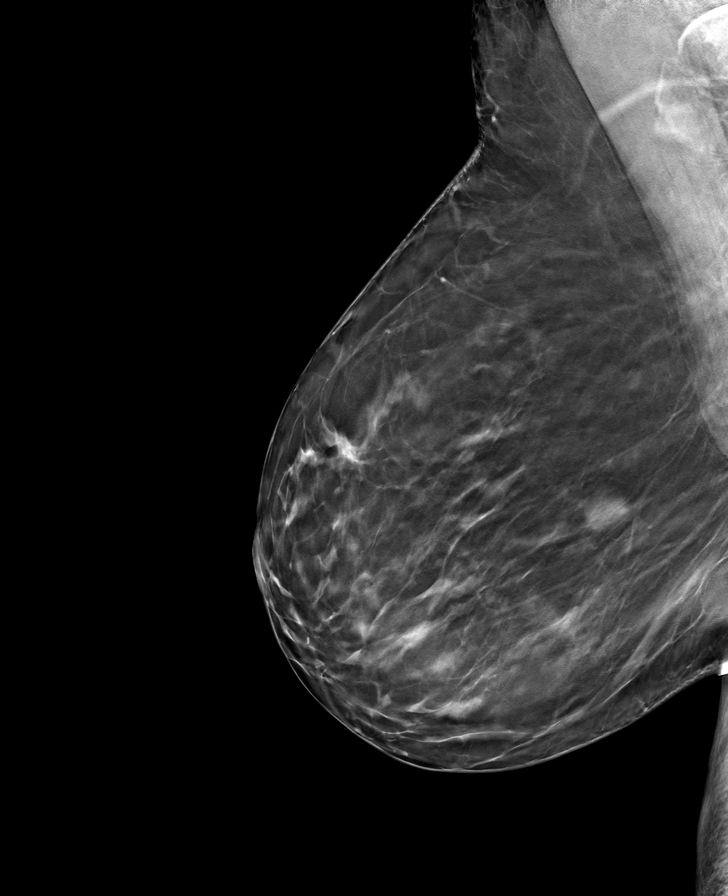

[R CC tomo · tomo slice 33/66.0]
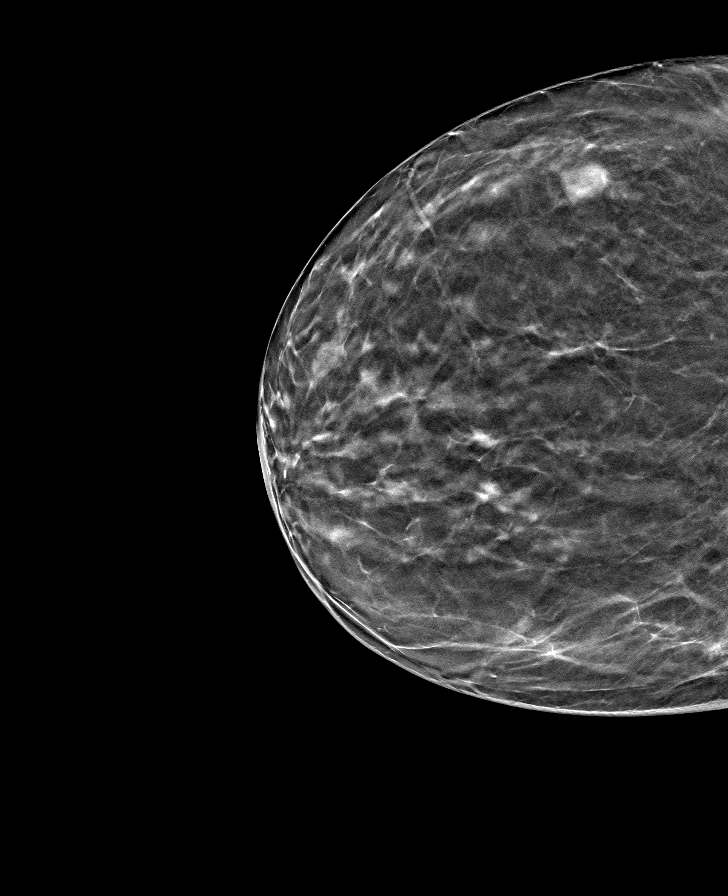

[8 of 24 positions shown; findings below may reference images not displayed]

ACR Breast Density Category b: There are scattered areas of
fibroglandular density.
FINDINGS: There are no findings suspicious for malignancy.
IMPRESSION: No mammographic evidence of malignancy. A result letter of this
screening mammogram will be mailed directly to the patient.

RECOMMENDATION:
Screening mammogram in one year. (Code:[BY])

BI-RADS CATEGORY  1: Negative.

## 2021-11-19 ENCOUNTER — Other Ambulatory Visit: Payer: Self-pay | Admitting: Nurse Practitioner

## 2021-11-19 DIAGNOSIS — R7989 Other specified abnormal findings of blood chemistry: Secondary | ICD-10-CM

## 2021-11-21 ENCOUNTER — Other Ambulatory Visit: Payer: Self-pay | Admitting: Nurse Practitioner

## 2021-11-21 DIAGNOSIS — R7989 Other specified abnormal findings of blood chemistry: Secondary | ICD-10-CM

## 2021-11-28 ENCOUNTER — Ambulatory Visit: Payer: BC Managed Care – PPO

## 2021-12-02 NOTE — Progress Notes (Signed)
Patient on schedule for Liver biopsy 4/12, called and spoke with patient with pre procedure instructions given. Made aware to be here @ 0900, NPO after MN prior to procedure as well as holding ASA 81 mg after LD 4/7,Not taking due to CAD, driver post procedure/recovery./discharge. stated understanding. ?

## 2021-12-10 ENCOUNTER — Other Ambulatory Visit: Payer: Self-pay | Admitting: Radiology

## 2021-12-11 ENCOUNTER — Ambulatory Visit
Admission: RE | Admit: 2021-12-11 | Discharge: 2021-12-11 | Disposition: A | Discharge status: Home / Self Care | Payer: BC Managed Care – PPO | Source: Ambulatory Visit | Attending: Nurse Practitioner | Admitting: Nurse Practitioner

## 2021-12-11 ENCOUNTER — Other Ambulatory Visit: Payer: Self-pay

## 2021-12-11 DIAGNOSIS — B179 Acute viral hepatitis, unspecified: Secondary | ICD-10-CM | POA: Diagnosis not present

## 2021-12-11 DIAGNOSIS — R7989 Other specified abnormal findings of blood chemistry: Secondary | ICD-10-CM

## 2021-12-11 LAB — PROTIME-INR
INR: 1 (ref 0.8–1.2)
Prothrombin Time: 12.9 seconds (ref 11.4–15.2)

## 2021-12-11 LAB — CBC
HCT: 45.5 % (ref 36.0–46.0)
Hemoglobin: 14.8 g/dL (ref 12.0–15.0)
MCH: 28.6 pg (ref 26.0–34.0)
MCHC: 32.5 g/dL (ref 30.0–36.0)
MCV: 87.8 fL (ref 80.0–100.0)
Platelets: 233 10*3/uL (ref 150–400)
RBC: 5.18 MIL/uL — ABNORMAL HIGH (ref 3.87–5.11)
RDW: 13.4 % (ref 11.5–15.5)
WBC: 5.7 10*3/uL (ref 4.0–10.5)
nRBC: 0 % (ref 0.0–0.2)

## 2021-12-11 IMAGING — US US BIOPSY CORE LIVER
1 series · 7 of 7 positions shown · non-contrast
Comparison: none

INDICATION: ELEVATED LFTS

[Series 1: us biopsy (liver) · 7 of 7 slices shown]
[im 1/7]
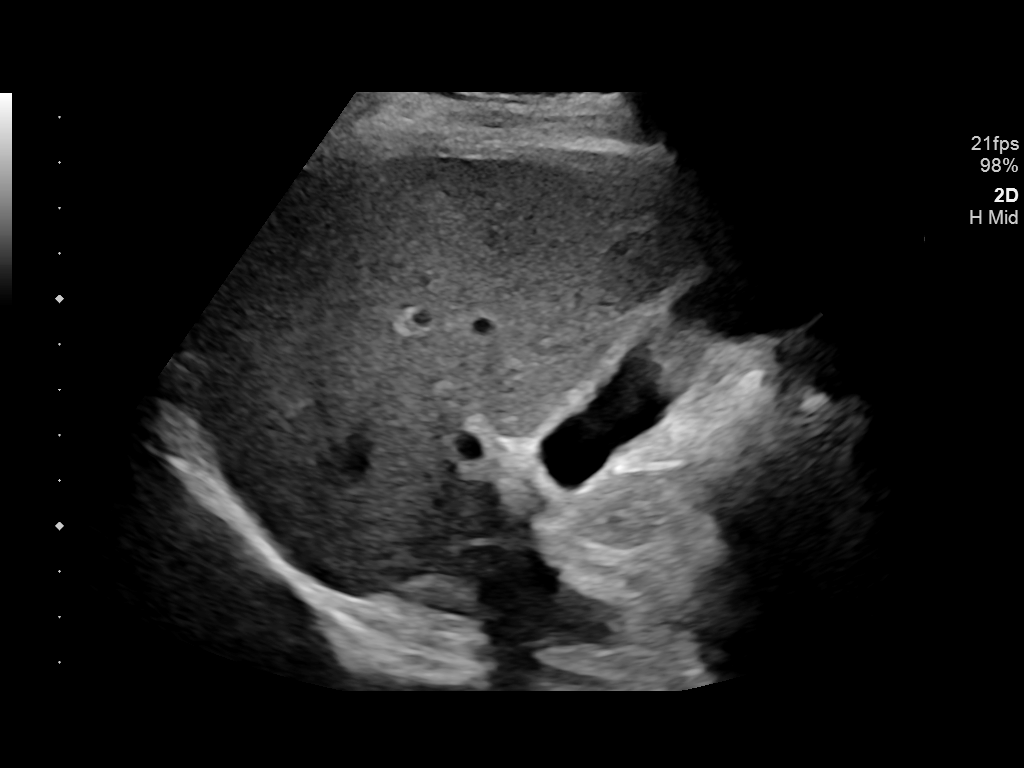
[im 2/7]
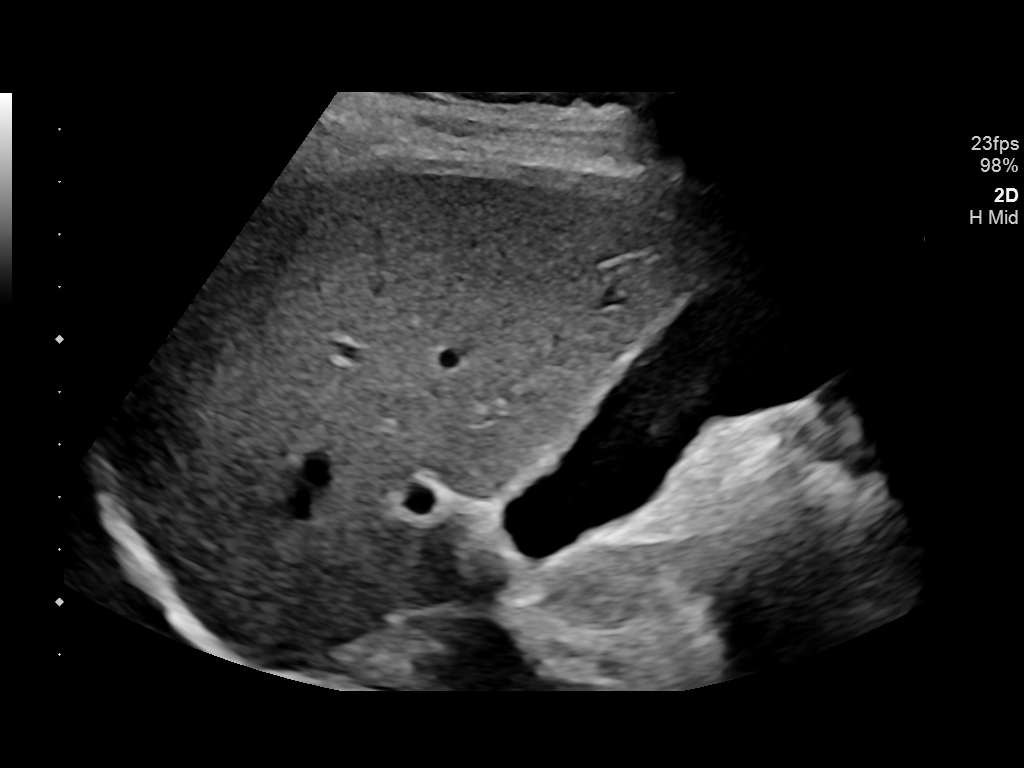
[im 3/7]
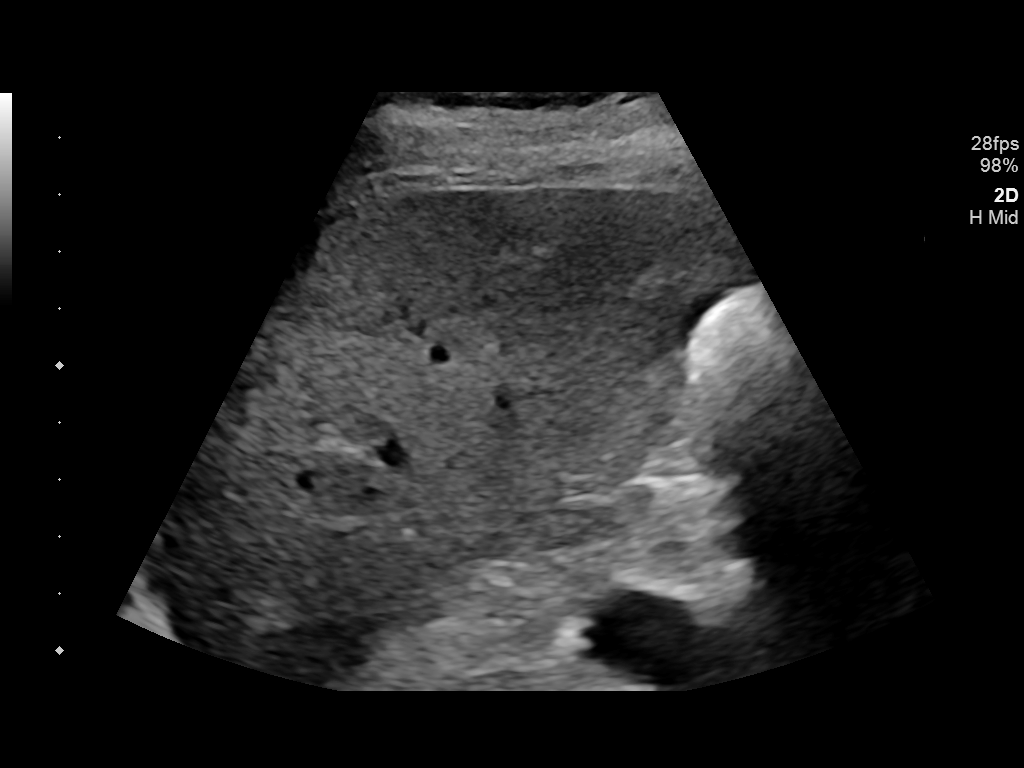
[im 4/7]
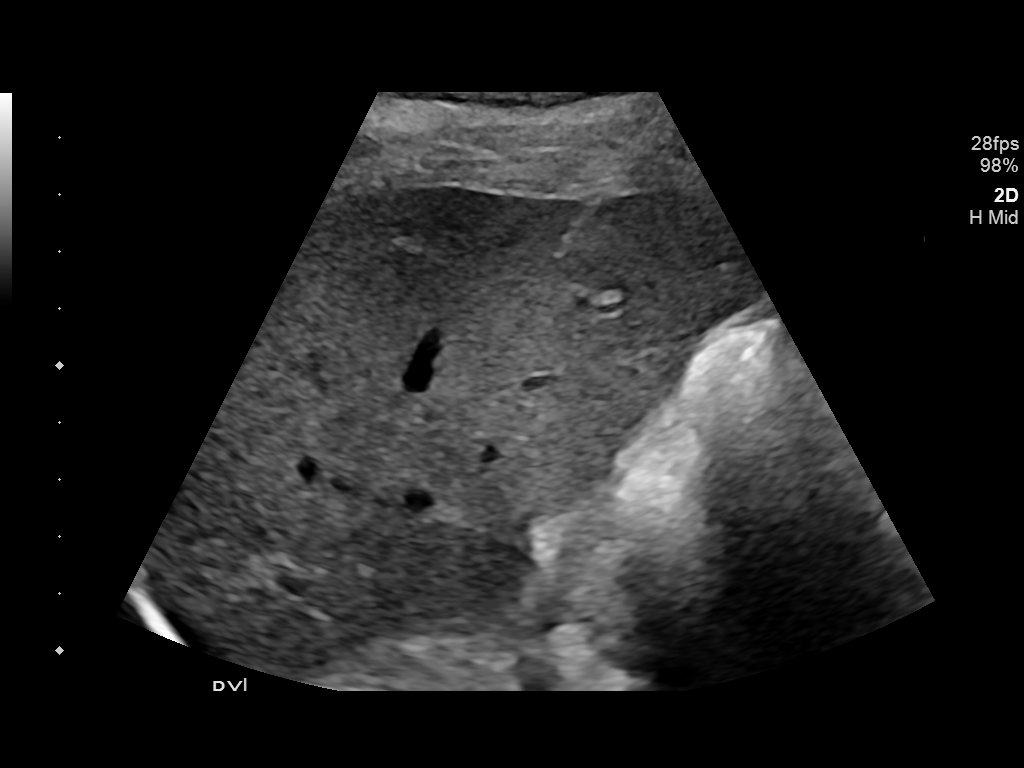
[im 5/7]
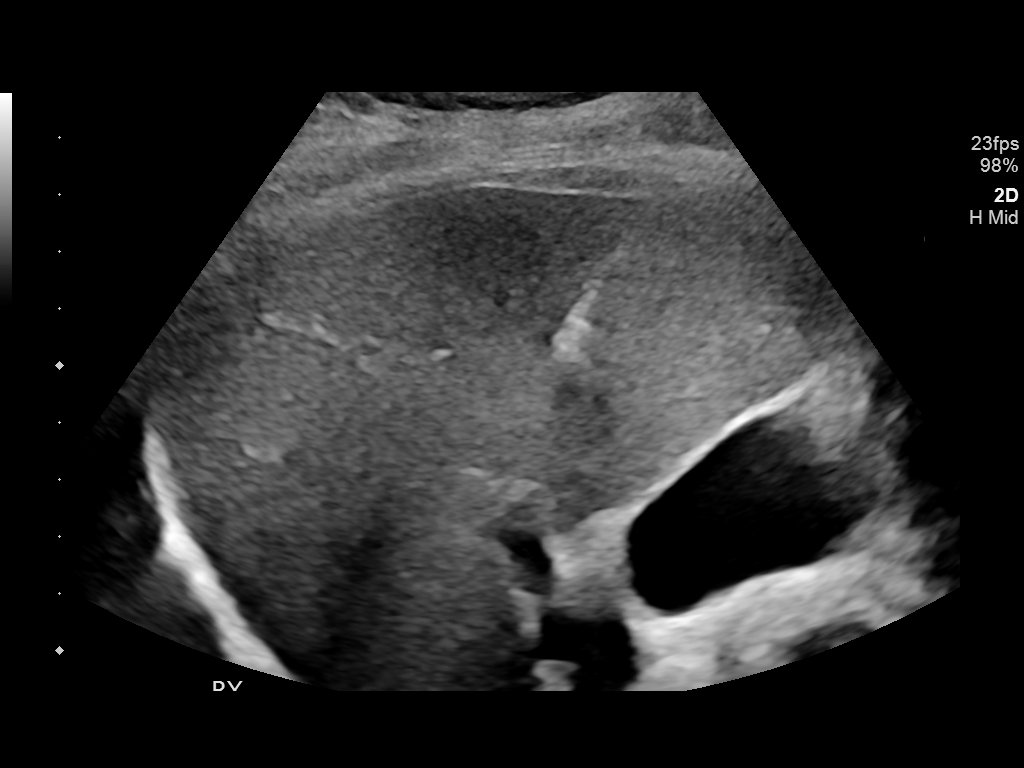
[im 6/7]
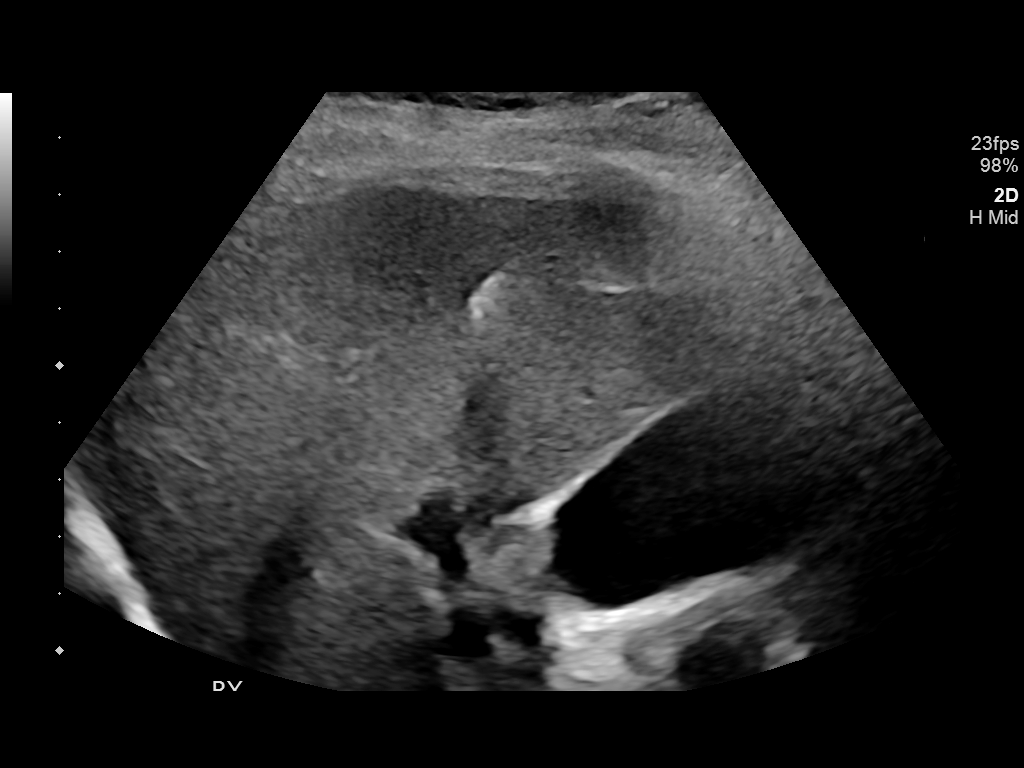
[im 7/7]
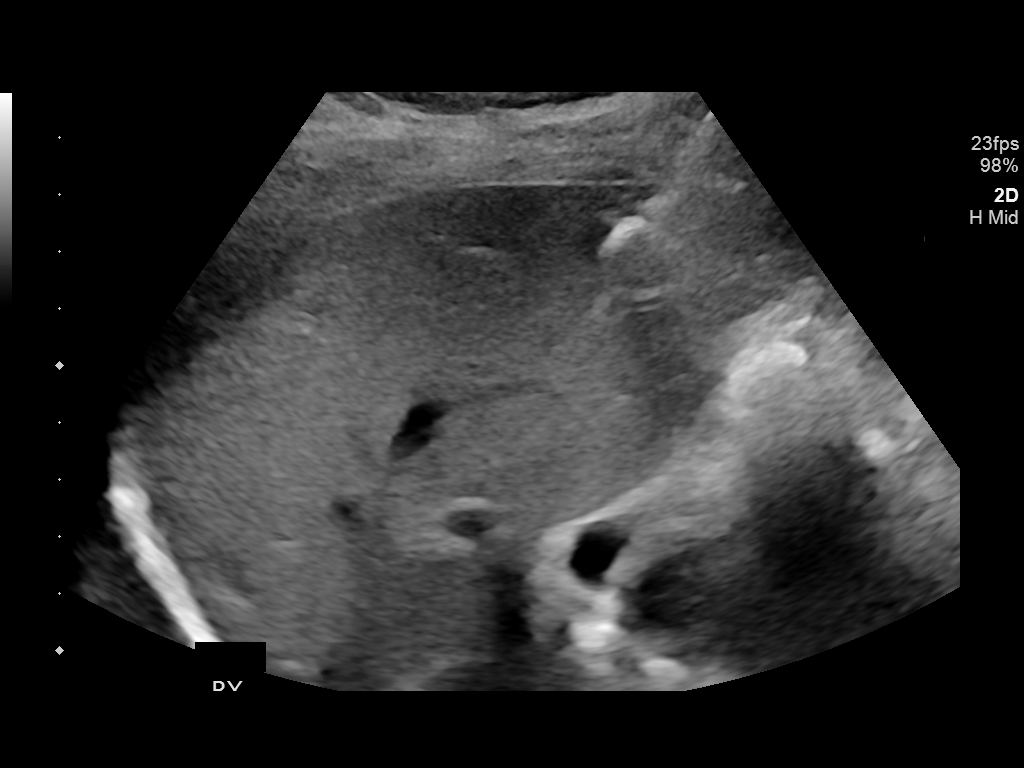

[7 of 7 positions shown; findings below may reference images not displayed]

EXAM:
ULTRASOUND GUIDED CORE BIOPSY OF RIGHT HEPATIC LOBE

MEDICATIONS:
1% lidocaine local

ANESTHESIA/SEDATION:
Versed 1.0mg IV; Fentanyl [MN] IV;

Moderate Sedation Time:  14 minute

The patient was continuously monitored during the procedure by the
interventional radiology nurse under my direct supervision.

FLUOROSCOPY:
Fluoroscopy Time: None.

COMPLICATIONS:
None immediate.

PROCEDURE:
The procedure, risks, benefits, and alternatives were explained to
the patient. Questions regarding the procedure were encouraged and
answered. The patient understands and consents to the procedure.

Previous imaging reviewed. Preliminary ultrasound performed. Right
hepatic lobe was localized in the mid axillary line through a lower
intercostal space. Overlying skin marked for biopsy.

Under sterile conditions and local anesthesia, a 17 gauge 6.8 cm
access needle was advanced into the right hepatic lobe peripherally.
Needle position confirmed with ultrasound. 2 intact 18 gauge core
biopsies obtained. These were placed in formalin. Needle tract
occluded with Gel-Foam. Postprocedure imaging demonstrates no
hemorrhage or hematoma. Patient tolerated biopsy well.
FINDINGS: Imaging confirms needle placed in the right hepatic lobe for random
18 gauge core biopsy
IMPRESSION: Successful ultrasound right hepatic lobe random 18 gauge core biopsy

## 2021-12-11 MED ORDER — FENTANYL CITRATE (PF) 100 MCG/2ML IJ SOLN
INTRAMUSCULAR | Status: AC
  Filled 2021-12-11: qty 2

## 2021-12-11 MED ORDER — MIDAZOLAM HCL 2 MG/2ML IJ SOLN
INTRAMUSCULAR | Status: AC
  Filled 2021-12-11: qty 2

## 2021-12-11 MED ORDER — MIDAZOLAM HCL 2 MG/2ML IJ SOLN
INTRAMUSCULAR | Status: AC | PRN
  Administered 2021-12-11: 1 mg via INTRAVENOUS

## 2021-12-11 MED ORDER — SODIUM CHLORIDE 0.9 % IV SOLN: INTRAVENOUS | Status: DC

## 2021-12-11 MED ORDER — FENTANYL CITRATE (PF) 100 MCG/2ML IJ SOLN
INTRAMUSCULAR | Status: AC | PRN
Start: 2021-12-11 — End: 2021-12-11
  Administered 2021-12-11: 50 ug via INTRAVENOUS

## 2021-12-11 NOTE — Progress Notes (Signed)
Completed at 1045 ?

## 2021-12-11 NOTE — H&P (Signed)
? ?Chief Complaint: ?Patient was seen in consultation today for elevated liver function labs at the request of Mills,Kimberly A ? ?Referring Physician(s): ?Mills,Kimberly A ? ?Supervising Physician: Ruel FavorsShick, Trevor ? ?Patient Status: [facility] - Out-pt ? ?History of Present Illness: ?April Lindsey is a 66 y.o. female who presents today after seeing GI recently for elevated liver function labs that were felt to be secondary to supplements, the patient stopped the supplements and labs were redrawn months later with persistent elevation. The patient is scheduled today for an elective non target liver biopsy with moderate sedation for better evaluation.  ? ?The patient denies any current abdominal pain, chest pain or shortness of breath. She denies any current blood thinner use, denies any known bleeding or clotting disorder. The patient denies any history of sleep apnea or chronic oxygen use. She has no known complications to sedation.  ?  ? ?Past Medical History:  ?Diagnosis Date  ? Anxiety   ? Arthritis   ? COVID-19 12/2020  ? never vaccinated  ? Fibromyalgia   ? Herniated lumbar intervertebral disc   ? ? ?Past Surgical History:  ?Procedure Laterality Date  ? ABDOMINAL HYSTERECTOMY    ? BILATERAL CARPAL TUNNEL RELEASE    ? BREAST BIOPSY Right 1997  ? benign - Dr Yolanda BonineBertrand  ? RECTOVAGINAL FISTULA CLOSURE    ? SEPTOPLASTY    ? ? ?Allergies: ?Codeine, Morphine and related, Penicillins, Erythromycin, and Tetracyclines & related ? ?Medications: ?Prior to Admission medications   ?Medication Sig Start Date End Date Taking? Authorizing Provider  ?cholecalciferol (VITAMIN D3) 25 MCG (1000 UNIT) tablet Take 1,000 Units by mouth daily.   Yes [provider]  ?citalopram (CELEXA) 10 MG tablet Take 2 tablets (20 mg total) by mouth daily. 02/22/21 10/04/26 Yes Rodolph Bonghompson, Daniel V, MD  ?Menthol, Topical Analgesic, (BIOFREEZE ROLL-ON EX) Apply 1 application topically daily as needed.   Yes [provider]  ?amLODipine  (NORVASC) 5 MG tablet Take 1 tablet (5 mg total) by mouth daily. ?Patient not taking: Reported on 12/11/2021 02/23/21   Rodolph Bonghompson, Daniel V, MD  ?ascorbic acid (VITAMIN C) 1000 MG tablet Take 1,000 mg by mouth daily. ?Patient not taking: Reported on 12/11/2021    [provider]  ?aspirin EC 81 MG EC tablet Take 1 tablet (81 mg total) by mouth daily. Swallow whole. 02/23/21   Rodolph Bonghompson, Daniel V, MD  ?co-enzyme Q-10 30 MG capsule Take 30 mg by mouth 3 (three) times daily. ?Patient not taking: Reported on 12/11/2021    [provider]  ?diclofenac Sodium (VOLTAREN) 1 % GEL Apply 2 g topically 4 (four) times daily as needed (pain). ?Patient not taking: Reported on 12/11/2021 02/22/21   Rodolph Bonghompson, Daniel V, MD  ?Garlic 10 MG CAPS Take 10 mg by mouth daily. ?Patient not taking: Reported on 12/11/2021    [provider]  ?HYDROcodone-acetaminophen (NORCO/VICODIN) 5-325 MG tablet Take 1 tablet by mouth at bedtime as needed. May take up to every 6 hours as needed for pain if not working or driving ?Patient not taking: Reported on 12/11/2021 02/17/21   [provider]  ?magnesium oxide (MAG-OX) 400 MG tablet Take 400 mg by mouth daily. ?Patient not taking: Reported on 12/11/2021    [provider]  ?Multiple Vitamin (MULTI-VITAMIN) tablet Take 1 tablet by mouth daily. ?Patient not taking: Reported on 12/11/2021    [provider]  ?Omega-3 1000 MG CAPS Take 1 capsule by mouth daily. ?Patient not taking: Reported on 12/11/2021  [provider]  ?polyethylene glycol (MIRALAX / GLYCOLAX) 17 g packet Take 17 g by mouth 2 (two) times daily. ?Patient not taking: Reported on 12/11/2021 02/22/21   Rodolph Bong, MD  ?predniSONE (DELTASONE) 20 MG tablet Take 1 tablet (20 mg total) by mouth daily. 5 days ?Patient not taking: Reported on 12/11/2021 02/22/21   Rodolph Bong, MD  ?senna-docusate (SENOKOT-S) 8.6-50 MG tablet Take 1 tablet by mouth at bedtime. ?Patient not taking:  Reported on 12/11/2021 02/22/21   Rodolph Bong, MD  ?triamcinolone (NASACORT) 55 MCG/ACT AERO nasal inhaler Place 2 sprays into the nose daily. ?Patient not taking: Reported on 12/11/2021    [provider]  ?Turmeric (QC TUMERIC COMPLEX PO) Take 1 tablet by mouth daily. ?Patient not taking: Reported on 12/11/2021    [provider]  ?zinc gluconate 50 MG tablet Take 50 mg by mouth daily. ?Patient not taking: Reported on 12/11/2021    [provider]  ?  ? ?Family History  ?Problem Relation Age of Onset  ? Breast cancer Paternal Grandmother 11  ? Breast cancer Other   ? ? ?Social History  ? ?Socioeconomic History  ? Marital status: Married  ?  Spouse name: wayne  ? Number of children: 1  ? Years of education: Not on file  ? Highest education level: Not on file  ?Occupational History  ? Not on file  ?Tobacco Use  ? Smoking status: Never  ? Smokeless tobacco: Never  ?Vaping Use  ? Vaping Use: Never used  ?Substance and Sexual Activity  ? Alcohol use: No  ? Drug use: Not on file  ? Sexual activity: Not on file  ?Other Topics Concern  ? Not on file  ?Social History Narrative  ? Lives at home with spouse & 2 cats  ? ?Social Determinants of Health  ? ?Financial Resource Strain: Not on file  ?Food Insecurity: Not on file  ?Transportation Needs: Not on file  ?Physical Activity: Not on file  ?Stress: Not on file  ?Social Connections: Not on file  ? ?Review of Systems: A 12 point ROS discussed and pertinent positives are indicated in the HPI above.  All other systems are negative. ? ?Review of Systems ? ?Vital Signs: ?BP 134/89   Pulse 76   Temp 98 ?F (36.7 ?C) (Oral)   Resp 20   Ht 5\' 2"  (1.575 m)   Wt 156 lb (70.8 kg)   BMI 28.53 kg/m?  ? ?Physical Exam ?Constitutional:   ?   Appearance: Normal appearance.  ?HENT:  ?   Head: Normocephalic and atraumatic.  ?Cardiovascular:  ?   Rate and Rhythm: Normal rate and regular rhythm.  ?Pulmonary:  ?   Effort: Pulmonary effort is normal. No  respiratory distress.  ?Abdominal:  ?   Palpations: Abdomen is soft.  ?Skin: ?   General: Skin is warm and dry.  ?Neurological:  ?   General: No focal deficit present.  ?   Mental Status: She is alert and oriented to person, place, and time.  ? ? ?Imaging: ?No results found. ? ?Labs: ? ?CBC: ?Recent Labs  ?  02/20/21 ?1903 02/21/21 ?02/23/21 03/21/21 ?1220 12/11/21 ?0919  ?WBC 12.5* 11.7* 7.9 5.7  ?HGB 14.5 13.4 14.2 14.8  ?HCT 42.6 39.2 42.4 45.5  ?PLT 272 242 339 233  ? ? ?COAGS: ?Recent Labs  ?  02/20/21 ?1903 03/21/21 ?1220 12/11/21 ?0919  ?INR 0.9 1.0 1.0  ?APTT 26  --   --   ? ? ?  BMP: ?Recent Labs  ?  02/20/21 ?1903 02/21/21 ?6314 02/22/21 ?0602 03/21/21 ?1220  ?NA 140 141 138 137  ?K 3.2* 3.8 3.5 3.3*  ?CL 104 107 108 106  ?CO2 28 27 24  18*  ?GLUCOSE 123* 99 107* 150*  ?BUN 18 15 10 16   ?CALCIUM 9.4 8.6* 8.7* 9.3  ?CREATININE 0.83 0.58 0.46 0.74  ?GFRNONAA >60 >60 >60 >60  ? ? ?LIVER FUNCTION TESTS: ?Recent Labs  ?  02/20/21 ?1903  ?BILITOT 0.7  ?AST 22  ?ALT 20  ?ALKPHOS 60  ?PROT 7.2  ?ALBUMIN 4.1  ? ? ?Assessment and Plan: ?This is a 66 year old female who presents today after seeing GI recently for elevated liver function labs that were felt to be secondary to supplements, the patient stopped the supplements and labs were redrawn months later with persistent elevation. The patient is scheduled today for an elective non target liver biopsy with moderate sedation.  ? ?The patient has been NPO, no blood thinners taken, labs and vitals have been reviewed. ? ?Risks and benefits of image guided non target liver biopsy with moderate sedation was discussed with the patient and/or patient's family including, but not limited to bleeding, infection, damage to adjacent structures or low yield requiring additional tests. ? ?All of the questions were answered and there is agreement to proceed. ? ?Consent signed and in chart. ? ? ?Thank you for this interesting consult.  I greatly enjoyed meeting April Lindsey and look  forward to participating in their care.  A copy of this report was sent to the requesting provider on this date. ? ?Electronically Signed: ?77 D, PA-C ?12/11/2021, 10:10 AM ? ? ?I spent a total of 15 Minutes

## 2021-12-11 NOTE — Procedures (Signed)
Interventional Radiology Procedure Note ? ?Procedure: [place] RT LIVER RANDOM CORE BX   ? ?Complications: None ? ?Estimated Blood Loss:  MIN ? ?Findings: ?84 G CORE X 2 ?FULL REPORT IN PACS ?   ? ?M. Ruel Favors, MD ? ? ? ?

## 2021-12-13 LAB — SURGICAL PATHOLOGY

## 2022-03-10 ENCOUNTER — Other Ambulatory Visit: Payer: Self-pay | Admitting: Physician Assistant

## 2022-03-10 DIAGNOSIS — Z1231 Encounter for screening mammogram for malignant neoplasm of breast: Secondary | ICD-10-CM

## 2022-05-28 ENCOUNTER — Other Ambulatory Visit: Payer: Self-pay | Admitting: Family Medicine

## 2022-05-28 DIAGNOSIS — M5416 Radiculopathy, lumbar region: Secondary | ICD-10-CM

## 2022-06-11 ENCOUNTER — Ambulatory Visit
Admission: RE | Admit: 2022-06-11 | Discharge: 2022-06-11 | Disposition: A | Discharge status: Home / Self Care | Payer: BC Managed Care – PPO | Source: Ambulatory Visit | Attending: Family Medicine | Admitting: Family Medicine

## 2022-06-11 DIAGNOSIS — M5416 Radiculopathy, lumbar region: Secondary | ICD-10-CM

## 2022-11-05 ENCOUNTER — Ambulatory Visit
Admission: RE | Admit: 2022-11-05 | Discharge: 2022-11-05 | Disposition: A | Discharge status: Home / Self Care | Payer: Medicare HMO | Source: Ambulatory Visit | Attending: Physician Assistant | Admitting: Physician Assistant

## 2022-11-05 DIAGNOSIS — Z1231 Encounter for screening mammogram for malignant neoplasm of breast: Secondary | ICD-10-CM | POA: Insufficient documentation

## 2023-10-30 ENCOUNTER — Other Ambulatory Visit: Payer: Self-pay | Admitting: Physician Assistant

## 2023-10-30 DIAGNOSIS — Z1231 Encounter for screening mammogram for malignant neoplasm of breast: Secondary | ICD-10-CM

## 2023-11-24 ENCOUNTER — Ambulatory Visit
Admission: RE | Admit: 2023-11-24 | Discharge: 2023-11-24 | Disposition: A | Discharge status: Home / Self Care | Payer: Medicare HMO | Source: Ambulatory Visit | Attending: Physician Assistant | Admitting: Physician Assistant

## 2023-11-24 DIAGNOSIS — Z1231 Encounter for screening mammogram for malignant neoplasm of breast: Secondary | ICD-10-CM | POA: Diagnosis present

## 2023-11-26 ENCOUNTER — Other Ambulatory Visit: Payer: Self-pay | Admitting: Physician Assistant

## 2023-11-26 DIAGNOSIS — R928 Other abnormal and inconclusive findings on diagnostic imaging of breast: Secondary | ICD-10-CM

## 2023-12-03 ENCOUNTER — Ambulatory Visit
Admission: RE | Admit: 2023-12-03 | Discharge: 2023-12-03 | Disposition: A | Discharge status: Home / Self Care | Source: Ambulatory Visit | Attending: Physician Assistant | Admitting: Physician Assistant

## 2023-12-03 DIAGNOSIS — R928 Other abnormal and inconclusive findings on diagnostic imaging of breast: Secondary | ICD-10-CM | POA: Diagnosis present

## 2023-12-07 ENCOUNTER — Other Ambulatory Visit: Payer: Self-pay | Admitting: Physician Assistant

## 2023-12-07 DIAGNOSIS — N6311 Unspecified lump in the right breast, upper outer quadrant: Secondary | ICD-10-CM

## 2023-12-15 ENCOUNTER — Other Ambulatory Visit: Payer: Self-pay | Admitting: Physician Assistant

## 2023-12-15 DIAGNOSIS — Z1231 Encounter for screening mammogram for malignant neoplasm of breast: Secondary | ICD-10-CM

## 2023-12-16 ENCOUNTER — Ambulatory Visit
Admission: RE | Admit: 2023-12-16 | Discharge: 2023-12-16 | Disposition: A | Discharge status: Home / Self Care | Source: Ambulatory Visit | Attending: Physician Assistant | Admitting: Physician Assistant

## 2023-12-16 DIAGNOSIS — R928 Other abnormal and inconclusive findings on diagnostic imaging of breast: Secondary | ICD-10-CM | POA: Insufficient documentation

## 2023-12-16 DIAGNOSIS — Z1231 Encounter for screening mammogram for malignant neoplasm of breast: Secondary | ICD-10-CM

## 2023-12-16 DIAGNOSIS — N6011 Diffuse cystic mastopathy of right breast: Secondary | ICD-10-CM | POA: Diagnosis present

## 2023-12-16 HISTORY — PX: BREAST BIOPSY: SHX20

## 2023-12-17 LAB — SURGICAL PATHOLOGY

## 2024-08-23 ENCOUNTER — Other Ambulatory Visit: Payer: Self-pay | Admitting: Physician Assistant

## 2024-08-23 DIAGNOSIS — Z1231 Encounter for screening mammogram for malignant neoplasm of breast: Secondary | ICD-10-CM
